# Patient Record
Sex: Female | Born: 1940 | Race: White | Hispanic: No | Smoking: Never smoker
Health system: Southern US, Community
[De-identification: ages and names within clinical notes are randomized; demographics above are authoritative.]

## PROBLEM LIST (undated history)

## (undated) DIAGNOSIS — I499 Cardiac arrhythmia, unspecified: Secondary | ICD-10-CM

## (undated) DIAGNOSIS — Q762 Congenital spondylolisthesis: Secondary | ICD-10-CM

## (undated) DIAGNOSIS — I729 Aneurysm of unspecified site: Secondary | ICD-10-CM

## (undated) DIAGNOSIS — M4800 Spinal stenosis, site unspecified: Secondary | ICD-10-CM

## (undated) DIAGNOSIS — E785 Hyperlipidemia, unspecified: Secondary | ICD-10-CM

## (undated) DIAGNOSIS — I341 Nonrheumatic mitral (valve) prolapse: Secondary | ICD-10-CM

## (undated) HISTORY — PX: BREAST BIOPSY: SHX20

## (undated) HISTORY — DX: Cardiac arrhythmia, unspecified: I49.9

## (undated) HISTORY — PX: BREAST RECONSTRUCTION WITH PLACEMENT OF TISSUE EXPANDER AND ALLODERM: SHX6805

## (undated) HISTORY — PX: ABDOMINAL HYSTERECTOMY: SHX81

## (undated) HISTORY — PX: TUBAL LIGATION: SHX77

## (undated) HISTORY — DX: Spinal stenosis, site unspecified: M48.00

## (undated) HISTORY — DX: Congenital spondylolisthesis: Q76.2

## (undated) HISTORY — PX: AUGMENTATION MAMMAPLASTY: SUR837

## (undated) HISTORY — DX: Aneurysm of unspecified site: I72.9

## (undated) HISTORY — DX: Nonrheumatic mitral (valve) prolapse: I34.1

## (undated) HISTORY — DX: Hyperlipidemia, unspecified: E78.5

---

## 2020-03-30 LAB — HM MAMMOGRAPHY

## 2020-05-09 LAB — HM MAMMOGRAPHY

## 2020-09-23 ENCOUNTER — Encounter (HOSPITAL_COMMUNITY): Payer: Self-pay

## 2020-09-23 ENCOUNTER — Other Ambulatory Visit: Payer: Self-pay

## 2020-09-23 ENCOUNTER — Ambulatory Visit (HOSPITAL_COMMUNITY)
Admission: EM | Admit: 2020-09-23 | Discharge: 2020-09-23 | Disposition: A | Discharge status: Home / Self Care | Payer: PPO | Attending: Emergency Medicine | Admitting: Emergency Medicine

## 2020-09-23 DIAGNOSIS — N390 Urinary tract infection, site not specified: Secondary | ICD-10-CM | POA: Diagnosis not present

## 2020-09-23 DIAGNOSIS — R350 Frequency of micturition: Secondary | ICD-10-CM

## 2020-09-23 HISTORY — DX: Cardiac arrhythmia, unspecified: I49.9

## 2020-09-23 LAB — POCT URINALYSIS DIPSTICK, ED / UC
Bilirubin Urine: NEGATIVE
Glucose, UA: NEGATIVE mg/dL
Ketones, ur: NEGATIVE mg/dL
Nitrite: POSITIVE — AB
Protein, ur: NEGATIVE mg/dL
Specific Gravity, Urine: 1.01 (ref 1.005–1.030)
Urobilinogen, UA: 0.2 mg/dL (ref 0.0–1.0)
pH: 6 (ref 5.0–8.0)

## 2020-09-23 MED ORDER — NITROFURANTOIN MONOHYD MACRO 100 MG PO CAPS: 100.0000 mg | ORAL_CAPSULE | Freq: Two times a day (BID) | ORAL | 0 refills | Status: DC

## 2020-09-23 NOTE — Discharge Instructions (Addendum)
Take the Macrobid twice daily on an empty stomach for 5 days.  We will send off urine for culture and if we need to adjust the antibiotics based on the results we will.  You may continue the Azo as needed for urinary discomfort.  Increase your oral fluid intake so that you increase your urine production and flush your urinary system.  If you develop a fever, back pain, nausea or vomiting and cannot keep fluids down please go to the ER for evaluation.

## 2020-09-23 NOTE — ED Triage Notes (Signed)
Pt reports increased urinary frequency and lower back pain x 3-4 days. Denies fever, chills, nausea, abdominal pain. Metamine and sodium salicylate gives relief.

## 2020-09-23 NOTE — ED Provider Notes (Signed)
[place]    CSN: [id number] Arrival date & time: 09/23/20  1450      History   Chief Complaint Chief Complaint  Patient presents with  . Appointment  . Urinary Frequency    HPI Yolanda Grant is a 79 y.o. female.   HPI   79 year old female here for evaluation of urinary urgency, frequency, and pain.  Patient reports that her symptoms have been going on for the last 3 to 4 days.  She denies any blood in her urine, fever, nausea or vomiting, or abdominal pain.  Patient has had some associated low back pain and has noticed that her urine has been cloudy.  Patient has been taking Azo-Standard over-the-counter to help with the discomfort.  Patient has a longstanding history of UTIs  Past Medical History:  Diagnosis Date  . Irregular heartbeat     There are no problems to display for this patient.   Past Surgical History:  Procedure Laterality Date  . ABDOMINAL HYSTERECTOMY      OB History   No obstetric history on file.      Home Medications    Prior to Admission medications   Medication Sig Start Date End Date Taking? Authorizing Provider  nitrofurantoin, macrocrystal-monohydrate, (MACROBID) 100 MG capsule Take 1 capsule (100 mg total) by mouth 2 (two) times daily. 09/23/20   Margarette Canada, NP  pravastatin (PRAVACHOL) 20 MG tablet Take 20 mg by mouth daily. 09/20/20   [provider]  raloxifene (EVISTA) 60 MG tablet Take 60 mg by mouth daily. 09/20/20   [provider]    Family History History reviewed. No pertinent family history.  Social History Social History   Tobacco Use  . Smoking status: Never Smoker  . Smokeless tobacco: Never Used  Substance Use Topics  . Alcohol use: Not Currently  . Drug use: Never     Allergies   Augmentin [amoxicillin-pot clavulanate] and Sulfur   Review of Systems Review of Systems  Constitutional: Negative for fever.  Gastrointestinal: Negative for nausea and vomiting.   Genitourinary: Positive for dysuria, frequency and urgency. Negative for hematuria.  Musculoskeletal: Positive for back pain.  Skin: Negative for rash.  Hematological: Negative.   Psychiatric/Behavioral: Negative.      Physical Exam Triage Vital Signs ED Triage Vitals  Enc Vitals Group     BP 09/23/20 1511 (!) 107/55     Pulse Rate 09/23/20 1511 73     Resp 09/23/20 1511 17     Temp 09/23/20 1511 97.8 F (36.6 C)     Temp Source 09/23/20 1511 Oral     SpO2 09/23/20 1511 98 %     Weight --      Height --      Head Circumference --      Peak Flow --      Pain Score 09/23/20 1506 3     Pain Loc --      Pain Edu? --      Excl. in [place]? --    No data found.  Updated Vital Signs BP (!) 107/55 (BP Location: Right Arm)   Pulse 73   Temp 97.8 F (36.6 C) (Oral)   Resp 17   SpO2 98%   Visual Acuity Right Eye Distance:   Left Eye Distance:   Bilateral Distance:    Right Eye Near:   Left Eye Near:    Bilateral Near:     Physical Exam Vitals and nursing note reviewed.  Constitutional:  General: She is not in acute distress.    Appearance: Normal appearance. She is not toxic-appearing.  Cardiovascular:     Rate and Rhythm: Normal rate and regular rhythm.     Pulses: Normal pulses.     Heart sounds: Normal heart sounds. No murmur heard. No gallop.   Pulmonary:     Effort: Pulmonary effort is normal.     Breath sounds: Normal breath sounds. No wheezing, rhonchi or rales.  Abdominal:     Tenderness: There is no right CVA tenderness or left CVA tenderness.  Skin:    General: Skin is warm and dry.     Capillary Refill: Capillary refill takes less than 2 seconds.     Findings: No rash.  Neurological:     General: No focal deficit present.     Mental Status: She is alert and oriented to person, place, and time.  Psychiatric:        Mood and Affect: Mood normal.        Behavior: Behavior normal.        Thought Content: Thought content normal.        Judgment:  Judgment normal.      UC Treatments / Results  Labs (all labs ordered are listed, but only abnormal results are displayed) Labs Reviewed  POCT URINALYSIS DIPSTICK, ED / UC - Abnormal; Notable for the following components:      Result Value   Hgb urine dipstick SMALL (*)    Nitrite POSITIVE (*)    Leukocytes,Ua LARGE (*)    All other components within normal limits  URINE CULTURE    EKG   Radiology No results found.  Procedures Procedures (including critical care time)  Medications Ordered in UC Medications - No data to display  Initial Impression / Assessment and Plan / UC Course  I have reviewed the triage vital signs and the nursing notes.  Pertinent labs & imaging results that were available during my care of the patient were reviewed by me and considered in my medical decision making (see chart for details).   Is here for evaluation of UTI symptoms that started 3 to 4 days ago.  Patient is nontoxic in appearance.  Patient is negative CVA tenderness on exam.  UA dip is positive for nitrites and large amount of leukocytes with small blood.  Will send urine for culture.  Patient does have an allergy to Augmentin and sulfa.  Patient states she is unsure of exactly what her reaction to Augmentin was she reports that it made her very ill.  Will treat with Macrobid.   Final Clinical Impressions(s) / UC Diagnoses   Final diagnoses:  Lower urinary tract infectious disease     Discharge Instructions     Take the Macrobid twice daily on an empty stomach for 5 days.  We will send off urine for culture and if we need to adjust the antibiotics based on the results we will.  You may continue the Azo as needed for urinary discomfort.  Increase your oral fluid intake so that you increase your urine production and flush your urinary system.  If you develop a fever, back pain, nausea or vomiting and cannot keep fluids down please go to the ER for evaluation.    ED  Prescriptions    Medication Sig Dispense Auth. Provider   nitrofurantoin, macrocrystal-monohydrate, (MACROBID) 100 MG capsule Take 1 capsule (100 mg total) by mouth 2 (two) times daily. 10 capsule Margarette Canada, NP  PDMP not reviewed this encounter.   Margarette Canada, NP 09/23/20 1544

## 2020-09-26 LAB — URINE CULTURE
Culture: >=100000 — AB
Special Requests: NORMAL

## 2020-09-27 ENCOUNTER — Telehealth (HOSPITAL_COMMUNITY): Payer: Self-pay | Admitting: Urgent Care

## 2020-09-27 ENCOUNTER — Encounter (HOSPITAL_COMMUNITY): Payer: Self-pay | Admitting: Urgent Care

## 2020-09-27 MED ORDER — CEPHALEXIN 500 MG PO CAPS: 500.0000 mg | ORAL_CAPSULE | Freq: Two times a day (BID) | ORAL | 0 refills | Status: DC

## 2020-09-27 NOTE — Telephone Encounter (Signed)
Patient has been feeling ill still despite taking Macrobid.  Urine culture shows that susceptibility is therefore Macrobid and all other antibiotics.  Regarding her allergies, she has a history of intolerance to Augmentin but is able to take Keflex.  Recommended switching to this.  Have a low threshold to come in for recheck.

## 2020-11-06 DIAGNOSIS — M810 Age-related osteoporosis without current pathological fracture: Secondary | ICD-10-CM | POA: Insufficient documentation

## 2020-11-06 DIAGNOSIS — E785 Hyperlipidemia, unspecified: Secondary | ICD-10-CM | POA: Insufficient documentation

## 2020-11-06 NOTE — Progress Notes (Signed)
Subjective:    Patient ID: Yolanda Grant, female    DOB: Apr 07, 1941, 80 y.o.   MRN: [id number]  HPI  She is here to establish with a new pcp.   The patient is here for follow up of their chronic medical problems.  She has osteoporosis and is currently taking Evista. Her bone density is up-to-date. She will have her records sent up here.  She was also seen electrophysiologist where she lived previously. She has an irregular heart rate, but is not exactly sure what the official diagnosis is. She can have a cardiology note sent up. She takes metoprolol daily and that controls her irregular rhythm.  She also has hyperlipidemia and takes pravastatin. With moving here and not being into her regular routine she has not been eating as healthy.  She has not been exercising regularly. Years past she would exercise very regularly, but with moving and getting adjusted to her new routine she has not been doing that. She does plan on restarting.    Medications and allergies reviewed with patient and updated if appropriate.  Patient Active Problem List   Diagnosis Date Noted  . Hyperlipidemia 11/06/2020  . Osteoporosis 11/06/2020    Current Outpatient Medications on File Prior to Visit  Medication Sig Dispense Refill  . metoprolol succinate (TOPROL-XL) 50 MG 24 hr tablet Take 50 mg by mouth daily. Take with or immediately following a meal.    . pravastatin (PRAVACHOL) 20 MG tablet Take 20 mg by mouth daily.    . raloxifene (EVISTA) 60 MG tablet Take 60 mg by mouth daily.     No current facility-administered medications on file prior to visit.    Past Medical History:  Diagnosis Date  . Irregular heartbeat     Past Surgical History:  Procedure Laterality Date  . ABDOMINAL HYSTERECTOMY      Social History   Socioeconomic History  . Marital status: Widowed    Spouse name: Not on file  . Number of children: Not on file  . Years of education: Not on file  . Highest education  level: Not on file  Occupational History  . Not on file  Tobacco Use  . Smoking status: Never Smoker  . Smokeless tobacco: Never Used  Substance and Sexual Activity  . Alcohol use: Not Currently  . Drug use: Never  . Sexual activity: Not on file  Other Topics Concern  . Not on file  Social History Narrative  . Not on file   Social Determinants of Health   Financial Resource Strain: Not on file  Food Insecurity: Not on file  Transportation Needs: Not on file  Physical Activity: Not on file  Stress: Not on file  Social Connections: Not on file    History reviewed. No pertinent family history.  Review of Systems  Constitutional: Negative for chills and fever.  HENT: Negative for trouble swallowing.   Respiratory: Positive for shortness of breath. Negative for cough and wheezing.   Cardiovascular: Positive for leg swelling. Negative for chest pain and palpitations (rare - at night).  Gastrointestinal: Negative for abdominal pain, blood in stool, constipation, diarrhea and nausea.       GERD controlled  Genitourinary: Negative for dysuria and frequency.  Musculoskeletal: Positive for arthralgias (hands - not painful) and back pain (lower back  - if stands long periods).  Neurological: Negative for dizziness, light-headedness and headaches.  Psychiatric/Behavioral: Negative for dysphoric mood. The patient is not nervous/anxious.  Objective:   Vitals:   11/07/20 1259  BP: 112/68  Pulse: 60  Temp: 98 F (36.7 C)  SpO2: 99%   BP Readings from Last 3 Encounters:  11/07/20 112/68  09/23/20 (!) 107/55   Wt Readings from Last 3 Encounters:  11/07/20 157 lb (71.2 kg)   Body mass index is 26.13 kg/m.   Depression screen [facility] 2/9 11/07/2020  Decreased Interest 0  Down, Depressed, Hopeless 0  PHQ - 2 Score 0  Altered sleeping 0  Tired, decreased energy 0  Change in appetite 0  Feeling bad or failure about yourself  0  Trouble concentrating 0  Moving slowly or  fidgety/restless 0  Suicidal thoughts 0  PHQ-9 Score 0  Difficult doing work/chores Not difficult at all      Physical Exam    Constitutional: Appears well-developed and well-nourished. No distress.  HENT:  Head: Normocephalic and atraumatic.  Neck: Neck supple. No tracheal deviation present. No thyromegaly present.  No cervical lymphadenopathy Cardiovascular: Normal rate, regular rhythm and normal heart sounds.   No murmur heard. No carotid bruit .  No edema Pulmonary/Chest: Effort normal and breath sounds normal. No respiratory distress. No has no wheezes. No rales.  Skin: Skin is warm and dry. Not diaphoretic.  Psychiatric: Normal mood and affect. Behavior is normal.      Assessment & Plan:    See Problem List for Assessment and Plan of chronic medical problems.    Screened for depression using the PHQ 9 scale.  No evidence of depression.     This visit occurred during the SARS-CoV-2 public health emergency.  Safety protocols were in place, including screening questions prior to the visit, additional usage of staff PPE, and extensive cleaning of exam room while observing appropriate contact time as indicated for disinfecting solutions.

## 2020-11-07 ENCOUNTER — Ambulatory Visit (INDEPENDENT_AMBULATORY_CARE_PROVIDER_SITE_OTHER): Payer: PPO | Admitting: Internal Medicine

## 2020-11-07 ENCOUNTER — Other Ambulatory Visit: Payer: Self-pay

## 2020-11-07 ENCOUNTER — Encounter: Payer: Self-pay | Admitting: Internal Medicine

## 2020-11-07 ENCOUNTER — Ambulatory Visit: Payer: PPO | Admitting: Internal Medicine

## 2020-11-07 ENCOUNTER — Encounter (INDEPENDENT_AMBULATORY_CARE_PROVIDER_SITE_OTHER): Payer: Self-pay

## 2020-11-07 VITALS — BP 112/68 | HR 60 | Temp 98.0°F | Ht 65.0 in | Wt 157.0 lb

## 2020-11-07 DIAGNOSIS — I499 Cardiac arrhythmia, unspecified: Secondary | ICD-10-CM | POA: Diagnosis not present

## 2020-11-07 DIAGNOSIS — K219 Gastro-esophageal reflux disease without esophagitis: Secondary | ICD-10-CM | POA: Diagnosis not present

## 2020-11-07 DIAGNOSIS — I471 Supraventricular tachycardia: Secondary | ICD-10-CM | POA: Insufficient documentation

## 2020-11-07 DIAGNOSIS — M1990 Unspecified osteoarthritis, unspecified site: Secondary | ICD-10-CM | POA: Insufficient documentation

## 2020-11-07 DIAGNOSIS — M810 Age-related osteoporosis without current pathological fracture: Secondary | ICD-10-CM

## 2020-11-07 DIAGNOSIS — E782 Mixed hyperlipidemia: Secondary | ICD-10-CM | POA: Diagnosis not present

## 2020-11-07 DIAGNOSIS — I4719 Other supraventricular tachycardia: Secondary | ICD-10-CM | POA: Insufficient documentation

## 2020-11-07 NOTE — Patient Instructions (Addendum)
  Medications changes include :  none    A referral was ordered for Dr Curt Bears.       Someone from their office will call you to schedule an appointment.    Please followup in 6 months or so for a physical

## 2020-11-07 NOTE — Assessment & Plan Note (Signed)
Chronic Continue pravastatin 20 mg daily Regular exercise and healthy diet encouraged We will check blood work at her next visit

## 2020-11-07 NOTE — Assessment & Plan Note (Signed)
Chronic DEXA up-to-date-we will get those records Was on EVista for years, taken off for short period of time and then this was restarted 02/2020 Will review records once available Continue the evista arms daily Continue calcium and vitamin D Will restart regular exercise

## 2020-11-07 NOTE — Assessment & Plan Note (Signed)
Chronic GERD controlled Continue omeprazole 20 mg daily  

## 2020-11-07 NOTE — Assessment & Plan Note (Signed)
Chronic She is unsure what exactly irregularity she has Takes metoprolol XL 50 mg daily and this has controlled it Often feels it at night if she does have an episode-coughs and it goes away She would like to see an electrophysiologist-referred to Dr. Curt Bears She will have her records sent up Continue metoprolol XL 50 mg daily

## 2020-11-10 ENCOUNTER — Encounter: Payer: Self-pay | Admitting: Internal Medicine

## 2020-11-10 DIAGNOSIS — R3129 Other microscopic hematuria: Secondary | ICD-10-CM | POA: Insufficient documentation

## 2020-11-10 DIAGNOSIS — Z872 Personal history of diseases of the skin and subcutaneous tissue: Secondary | ICD-10-CM | POA: Insufficient documentation

## 2020-11-10 DIAGNOSIS — I351 Nonrheumatic aortic (valve) insufficiency: Secondary | ICD-10-CM | POA: Insufficient documentation

## 2020-11-10 DIAGNOSIS — Z87440 Personal history of urinary (tract) infections: Secondary | ICD-10-CM | POA: Insufficient documentation

## 2020-11-11 ENCOUNTER — Encounter: Payer: Self-pay | Admitting: Internal Medicine

## 2020-11-11 NOTE — Progress Notes (Signed)
Outside notes received. Information abstracted. Notes sent to scan.  

## 2020-12-02 ENCOUNTER — Telehealth: Payer: Self-pay | Admitting: Internal Medicine

## 2020-12-02 NOTE — Telephone Encounter (Signed)
Patient is requesting a refill on the following medication. She states the provider told her at her [place] on 1/31 she would send over whatever medications she needs.  metoprolol succinate (TOPROL-XL) 50 MG 24 hr tablet   Pharmacy on file.

## 2020-12-05 ENCOUNTER — Other Ambulatory Visit: Payer: Self-pay

## 2020-12-05 MED ORDER — METOPROLOL SUCCINATE ER 50 MG PO TB24: 50.0000 mg | ORAL_TABLET | Freq: Every day | ORAL | 1 refills | Status: DC

## 2020-12-05 NOTE — Telephone Encounter (Signed)
1 tab daily - 90 with 1 refill

## 2020-12-16 ENCOUNTER — Encounter: Payer: Self-pay | Admitting: Cardiology

## 2020-12-16 ENCOUNTER — Ambulatory Visit: Payer: PPO | Admitting: Cardiology

## 2020-12-16 ENCOUNTER — Other Ambulatory Visit: Payer: Self-pay

## 2020-12-16 VITALS — BP 126/74 | HR 61 | Ht 65.0 in | Wt 157.2 lb

## 2020-12-16 DIAGNOSIS — R002 Palpitations: Secondary | ICD-10-CM

## 2020-12-16 MED ORDER — PRAVASTATIN SODIUM 20 MG PO TABS: 20.0000 mg | ORAL_TABLET | Freq: Every day | ORAL | 3 refills | Status: DC

## 2020-12-16 NOTE — Patient Instructions (Signed)
Medication Instructions:  Your physician recommends that you continue on your current medications as directed. Please refer to the Current Medication list given to you today.  *If you need a refill on your cardiac medications before your next appointment, please call your pharmacy*   Lab Work: None ordered   Testing/Procedures: None ordered   Follow-Up: At [facility], you and your health needs are our priority.  As part of our continuing mission to provide you with exceptional heart care, we have created designated Provider Care Teams.  These Care Teams include your primary Cardiologist (physician) and Advanced Practice Providers (APPs -  Physician Assistants and Nurse Practitioners) who all work together to provide you with the care you need, when you need it.  Your next appointment:   1 year(s)  The format for your next appointment:   In Person  Provider:   General cardiology     Thank you for choosing [facility]!!   Trinidad Curet, RN [phone]

## 2020-12-16 NOTE — Progress Notes (Signed)
Electrophysiology Office Note   Date:  12/16/2020   ID:  Yolanda Grant, DOB April 09, 1941, MRN [id number]  PCP:  Binnie Rail, MD  General cardiology:   Primary Electrophysiologist:  Will Meredith Leeds, MD    Chief Complaint: Establish care   History of Present Illness: Yolanda Grant is a 80 y.o. female who is being seen today for the evaluation of palpitations at the request of Burns, Claudina Lick, MD. Presenting today for electrophysiology evaluation.  She has a history of mitral valve prolapse, mild to moderate aortic valve disease with moderate AI, hyperlipidemia.  There are referral notes stating that it is difficult to tell whether or not her aortic valve is bileaflet.    Today, she denies symptoms of palpitations, chest pain, shortness of breath, orthopnea, PND, lower extremity edema, claudication, dizziness, presyncope, syncope, bleeding, or neurologic sequela. The patient is tolerating medications without difficulties.  She has rare palpitations.  Palpitations occur every few weeks and last a minute or 2 at a time.  She is currently comfortable with her palpitations.  She is currently asymptomatic from her aortic and mitral valve disease.  She has no complaints at this time.  She presents to establish care.   Past Medical History:  Diagnosis Date  . Aneurysm ([place])   . Arrhythmia   . Congenital spondylolisthesis of lumbar region   . Hyperlipidemia   . Irregular heartbeat   . Mitral valve prolapse   . Spinal stenosis    Past Surgical History:  Procedure Laterality Date  . ABDOMINAL HYSTERECTOMY     anemia, heavy bleeding  . BREAST RECONSTRUCTION WITH PLACEMENT OF TISSUE EXPANDER AND ALLODERM    . TUBAL LIGATION       Current Outpatient Medications  Medication Sig Dispense Refill  . Ascorbic Acid (VITAMIN C PLUS WILD ROSE HIPS PO) Take by mouth.    . Biotin 10000 MCG TABS Take 10,000 mcg by mouth in the morning.    . Calcium Carb-Cholecalciferol (CALCIUM 1000 + D  PO) Take 1,000 mg by mouth in the morning.    . Calcium Carbonate-Vitamin D3 600-400 MG-UNIT TABS Take by mouth. 600 mg/ 800 if vitamin D3    . Cholecalciferol (VITAMIN D3) 50 MCG (2000 UT) TABS Take by mouth.    . Cranberry 500 MG CAPS Take 500 mg by mouth every evening.    . D-Mannose POWD Take by mouth.    . metoprolol succinate (TOPROL-XL) 50 MG 24 hr tablet Take 1 tablet (50 mg total) by mouth daily. Take one table by mouth daily 90 tablet 1  . Multiple Vitamin (ONE-DAILY MULTI-VITAMIN) TABS Take by mouth.    . OMEPRAZOLE PO Take 20 mg by mouth.    . pravastatin (PRAVACHOL) 20 MG tablet Take 20 mg by mouth daily.    . Probiotic Product (PROBIOTIC PO) Take by mouth.    . Psyllium (METAMUCIL FIBER) 51.7 % PACK Take by mouth.    . raloxifene (EVISTA) 60 MG tablet Take 60 mg by mouth daily.    . Turmeric 500 MG CAPS Take by mouth.    . Zinc 50 MG CAPS Take by mouth.     No current facility-administered medications for this visit.    Allergies:   Augmentin [amoxicillin-pot clavulanate] and Elemental sulfur   Social History:  The patient  reports that she has never smoked. She has never used smokeless tobacco. She reports previous alcohol use. She reports that she does not use drugs.   Family History:  The patient's family history includes Breast cancer in her mother.    ROS:  Please see the history of present illness.   Otherwise, review of systems is positive for none.   All other systems are reviewed and negative.    PHYSICAL EXAM: VS:  BP 126/74   Pulse 61   Ht 5\' 5"  (1.651 m)   Wt 157 lb 3.2 oz (71.3 kg)   SpO2 99%   BMI 26.16 kg/m  , BMI Body mass index is 26.16 kg/m. GEN: Well nourished, well developed, in no acute distress  HEENT: normal  Neck: no JVD, carotid bruits, or masses Cardiac: RRR; no murmurs, rubs, or gallops,no edema  Respiratory:  clear to auscultation bilaterally, normal work of breathing GI: soft, nontender, nondistended, + BS MS: no deformity or  atrophy  Skin: warm and dry Neuro:  Strength and sensation are intact Psych: euthymic mood, full affect  EKG:  EKG is ordered today. Personal review of the ekg ordered shows sinus rhythm, rate 61  Recent Labs: No results found for requested labs within last 8760 hours.    Lipid Panel  No results found for: CHOL, TRIG, HDL, CHOLHDL, VLDL, LDLCALC, LDLDIRECT   Wt Readings from Last 3 Encounters:  12/16/20 157 lb 3.2 oz (71.3 kg)  11/07/20 157 lb (71.2 kg)      Other studies Reviewed: Additional studies/ records that were reviewed today include: TTE 03/29/2020 Review of the above records today demonstrates:  Ejection fraction 55 to 60% Left atrial index 40 mL/m right ventricle normal in size and function Mild to moderate aortic regurgitation Trileaflet aortic valve Mild mitral regurgitation Mild tricuspid regurgitation Aortic root normal in size IVC is normal in size with normal collapsibility index No pericardial effusion  ASSESSMENT AND PLAN:  1.  Aortic insufficiency: Minimal murmur noted on exam.  Patient is well compensated.  No changes.  2.  Palpitations: Rarely.  Patient apparently is on Toprol-XL with palpitations.  We will continue with current management.  3.  Mitral valve prolapse with mitral regurgitation: Has remained stable.  Asymptomatic.    Current medicines are reviewed at length with the patient today.   The patient does not have concerns regarding her medicines.  The following changes were made today:  none  Labs/ tests ordered today include:  Orders Placed This Encounter  Procedures  . EKG 12-Lead     Disposition:   FU with 1 year  Signed, Will Meredith Leeds, MD  12/16/2020 10:26 AM     [facility] [address] [place] [address] [phone] (office) [phone] (fax)

## 2020-12-16 NOTE — Addendum Note (Signed)
Addended by: Stanton Kidney on: 12/16/2020 10:28 AM   Modules accepted: Orders

## 2021-01-04 DIAGNOSIS — Z79891 Long term (current) use of opiate analgesic: Secondary | ICD-10-CM | POA: Diagnosis not present

## 2021-01-04 DIAGNOSIS — M533 Sacrococcygeal disorders, not elsewhere classified: Secondary | ICD-10-CM | POA: Diagnosis not present

## 2021-01-04 DIAGNOSIS — M47817 Spondylosis without myelopathy or radiculopathy, lumbosacral region: Secondary | ICD-10-CM | POA: Diagnosis not present

## 2021-01-04 DIAGNOSIS — Z79899 Other long term (current) drug therapy: Secondary | ICD-10-CM | POA: Diagnosis not present

## 2021-01-04 DIAGNOSIS — G894 Chronic pain syndrome: Secondary | ICD-10-CM | POA: Diagnosis not present

## 2021-01-04 DIAGNOSIS — M5459 Other low back pain: Secondary | ICD-10-CM | POA: Diagnosis not present

## 2021-01-06 ENCOUNTER — Other Ambulatory Visit: Payer: Self-pay | Admitting: Physician Assistant

## 2021-01-06 ENCOUNTER — Ambulatory Visit
Admission: RE | Admit: 2021-01-06 | Discharge: 2021-01-06 | Disposition: A | Discharge status: Home / Self Care | Payer: PPO | Source: Ambulatory Visit | Attending: Physician Assistant | Admitting: Physician Assistant

## 2021-01-06 DIAGNOSIS — M545 Low back pain, unspecified: Secondary | ICD-10-CM | POA: Diagnosis not present

## 2021-01-06 DIAGNOSIS — M5459 Other low back pain: Secondary | ICD-10-CM

## 2021-02-13 ENCOUNTER — Ambulatory Visit: Payer: PPO | Admitting: Podiatry

## 2021-02-13 ENCOUNTER — Other Ambulatory Visit: Payer: Self-pay

## 2021-02-13 ENCOUNTER — Encounter: Payer: Self-pay | Admitting: Podiatry

## 2021-02-13 DIAGNOSIS — L603 Nail dystrophy: Secondary | ICD-10-CM | POA: Diagnosis not present

## 2021-02-13 DIAGNOSIS — M2041 Other hammer toe(s) (acquired), right foot: Secondary | ICD-10-CM | POA: Insufficient documentation

## 2021-02-13 DIAGNOSIS — M2042 Other hammer toe(s) (acquired), left foot: Secondary | ICD-10-CM | POA: Diagnosis not present

## 2021-02-13 NOTE — Progress Notes (Addendum)
This patient presents to the office with painful second toenail right foot.  She says this toenail is painful walking and wearing her shoes.  The second toenail is thick since she has hammer toe second right foot.  She has tried multiple types of hammer toe pads but the nail pain and hammer toes persists.  She presents to the office for evaluation and treatment.  Vascular  Dorsalis pedis and posterior tibial pulses are palpable  B/L.  Capillary return  WNL.  Temperature gradient is  WNL.  Skin turgor  WNL  Sensorium  Senn Weinstein monofilament wire  WNL. Normal tactile sensation.  Nail Exam  Patient has normal nails with no evidence of bacterial or fungal infection. Ingrowing toenail second toe right foot.  Orthopedic  Exam  Muscle tone and muscle strength  WNL.  No limitations of motion feet  B/L.  No crepitus or joint effusion noted.  Foot type is unremarkable.  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  Tailors bunion  5th  B/L.  Skin  No open lesions.  Normal skin texture and turgor.  Nail dystrophy secondary to hammer toe second right.  IE.  Debride thickened toenail second toe right foot.  Discussed her conservative treatment with this patient.  Patient says she desires surgical correction.  Patient to be referred to Dr. Paulla Dolly.  RTC prn.   Gardiner Barefoot DPM

## 2021-02-22 ENCOUNTER — Ambulatory Visit: Payer: PPO | Admitting: Podiatry

## 2021-02-23 ENCOUNTER — Other Ambulatory Visit: Payer: Self-pay

## 2021-02-23 ENCOUNTER — Ambulatory Visit: Payer: PPO | Admitting: Podiatry

## 2021-02-23 ENCOUNTER — Ambulatory Visit (INDEPENDENT_AMBULATORY_CARE_PROVIDER_SITE_OTHER): Payer: PPO

## 2021-02-23 DIAGNOSIS — M2041 Other hammer toe(s) (acquired), right foot: Secondary | ICD-10-CM

## 2021-02-23 NOTE — Progress Notes (Signed)
Subjective:   Patient ID: Yolanda Grant, female   DOB: 80 y.o.   MRN: [id number]   HPI Patient presents stating that she has a rigidly contracted digit to right which is increasingly painful and she was referred to me to have this fixed.  States he gets sore   ROS      Objective:  Physical Exam  Neurovascular status intact with patient found to have rigid contracture digit to right with irritation of the dorsal bone structure and elongation of the digit with nail disease also noted.  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  Patient is given consent form going over alternative treatments complications and after review signed consent form and is scheduled for outpatient surgery.  Patient is encouraged to call questions concerns prior and she met with our coordinator and went over the logistics of procedure.  I did explain there is no long-term guarantees of the toe probably will elevate some and may take upwards of 6 months to heal  X-rays indicate that there is rigid contracture digit to right at the MPJ with structural deformity at the proximal joint

## 2021-02-27 ENCOUNTER — Other Ambulatory Visit (INDEPENDENT_AMBULATORY_CARE_PROVIDER_SITE_OTHER): Payer: PPO

## 2021-02-27 ENCOUNTER — Other Ambulatory Visit: Payer: Self-pay

## 2021-02-27 ENCOUNTER — Telehealth: Payer: Self-pay | Admitting: Internal Medicine

## 2021-02-27 DIAGNOSIS — H31092 Other chorioretinal scars, left eye: Secondary | ICD-10-CM | POA: Diagnosis not present

## 2021-02-27 DIAGNOSIS — R300 Dysuria: Secondary | ICD-10-CM | POA: Diagnosis not present

## 2021-02-27 DIAGNOSIS — H2513 Age-related nuclear cataract, bilateral: Secondary | ICD-10-CM | POA: Diagnosis not present

## 2021-02-27 DIAGNOSIS — H04123 Dry eye syndrome of bilateral lacrimal glands: Secondary | ICD-10-CM | POA: Diagnosis not present

## 2021-02-27 DIAGNOSIS — H43812 Vitreous degeneration, left eye: Secondary | ICD-10-CM | POA: Diagnosis not present

## 2021-02-27 LAB — URINALYSIS, ROUTINE W REFLEX MICROSCOPIC
Bilirubin Urine: NEGATIVE
Ketones, ur: NEGATIVE
Nitrite: NEGATIVE
Specific Gravity, Urine: 1.015 (ref 1.000–1.030)
Total Protein, Urine: NEGATIVE
Urine Glucose: NEGATIVE
Urobilinogen, UA: 0.2 (ref 0.0–1.0)
pH: 6 (ref 5.0–8.0)

## 2021-02-27 NOTE — Telephone Encounter (Signed)
Can come and give sample- ordered

## 2021-02-27 NOTE — Telephone Encounter (Signed)
    Patient requesting order for urine test; frequent, painful urination  Please advise

## 2021-02-27 NOTE — Telephone Encounter (Signed)
Spoke with patient today. 

## 2021-02-28 ENCOUNTER — Telehealth: Payer: Self-pay | Admitting: Urology

## 2021-02-28 NOTE — Telephone Encounter (Signed)
DOS - 03/07/21   [facility] REPAIR 2ND RIGHT --- [id number]   [employer]  EFFECTIVE DATE - 10/08/05   RECEIVED FAX FROM [employer] STATING THAT CPT CODE 41282 HAS BEEN APPROVED, AUTH # [id number], GOOD FROM 03/07/21 - 06/05/21

## 2021-03-01 ENCOUNTER — Telehealth: Payer: Self-pay | Admitting: Internal Medicine

## 2021-03-01 LAB — URINE CULTURE

## 2021-03-01 MED ORDER — NITROFURANTOIN MONOHYD MACRO 100 MG PO CAPS: 100.0000 mg | ORAL_CAPSULE | Freq: Two times a day (BID) | ORAL | 0 refills | Status: DC

## 2021-03-01 NOTE — Telephone Encounter (Signed)
Urinalysis shows possible infection, urine culture is still pending. I am not sure when the culture will return.  We can go ahead and start an antibiotic given her history and if it comes back negative have her stop the  Antibiotic if negative.   Let me know

## 2021-03-01 NOTE — Telephone Encounter (Signed)
Patient called in regards to UA and urine culture lab results. She can be reached at [phone]. Please advise

## 2021-03-03 MED ORDER — HYDROCODONE-ACETAMINOPHEN 10-325 MG PO TABS: 1.0000 | ORAL_TABLET | Freq: Three times a day (TID) | ORAL | 0 refills | Status: AC | PRN

## 2021-03-03 NOTE — Addendum Note (Signed)
Addended by: Wallene Huh on: 03/03/2021 12:49 PM   Modules accepted: Orders

## 2021-03-07 ENCOUNTER — Encounter: Payer: Self-pay | Admitting: Podiatry

## 2021-03-07 DIAGNOSIS — M2041 Other hammer toe(s) (acquired), right foot: Secondary | ICD-10-CM | POA: Diagnosis not present

## 2021-03-10 ENCOUNTER — Telehealth: Payer: Self-pay

## 2021-03-10 NOTE — Telephone Encounter (Signed)
Follow up post op call.

## 2021-03-13 ENCOUNTER — Other Ambulatory Visit: Payer: Self-pay

## 2021-03-13 ENCOUNTER — Ambulatory Visit (INDEPENDENT_AMBULATORY_CARE_PROVIDER_SITE_OTHER): Payer: PPO

## 2021-03-13 ENCOUNTER — Encounter: Payer: Self-pay | Admitting: Podiatry

## 2021-03-13 ENCOUNTER — Ambulatory Visit (INDEPENDENT_AMBULATORY_CARE_PROVIDER_SITE_OTHER): Payer: PPO | Admitting: Podiatry

## 2021-03-13 DIAGNOSIS — Z9889 Other specified postprocedural states: Secondary | ICD-10-CM

## 2021-03-13 DIAGNOSIS — M2042 Other hammer toe(s) (acquired), left foot: Secondary | ICD-10-CM

## 2021-03-14 NOTE — Progress Notes (Signed)
Subjective:   Patient ID: Yolanda Grant, female   DOB: 80 y.o.   MRN: [id number]   HPI Patient states doing very well with surgery very pleased   ROS      Objective:  Physical Exam  Neurovascular status intact with second digit doing well right pin in place slight elevation of the toe but good alignment overall      Assessment:  Doing well post digital fusion digit to right     Plan:  H&P explained how to lower the toe and sterile dressing reapplied and reappoint 2 weeks for suture removal or earlier if needed  X-rays indicate pin is in place no other signs of pathology noted good alignment noted

## 2021-03-27 ENCOUNTER — Ambulatory Visit (INDEPENDENT_AMBULATORY_CARE_PROVIDER_SITE_OTHER): Payer: PPO | Admitting: Podiatry

## 2021-03-27 ENCOUNTER — Other Ambulatory Visit: Payer: Self-pay

## 2021-03-27 ENCOUNTER — Encounter: Payer: Self-pay | Admitting: Podiatry

## 2021-03-27 ENCOUNTER — Ambulatory Visit (INDEPENDENT_AMBULATORY_CARE_PROVIDER_SITE_OTHER): Payer: PPO

## 2021-03-27 DIAGNOSIS — M2042 Other hammer toe(s) (acquired), left foot: Secondary | ICD-10-CM

## 2021-03-27 DIAGNOSIS — Z9889 Other specified postprocedural states: Secondary | ICD-10-CM

## 2021-03-27 NOTE — Progress Notes (Signed)
Subjective:   Patient ID: Yolanda Grant, female   DOB: 80 y.o.   MRN: [id number]   HPI Patient presents stating that her right second toe is doing well pin is intact stitches need to be removed   ROS      Objective:  Physical Exam  Neurovascular status intact negative Bevelyn Buckles' sign noted pin intact second digit right stitches intact     Assessment:  Doing well post digital fusion digit to right     Plan:  X-ray reviewed stitches removed wound edges coapted well dispensed a above ankle fascial brace with BioSkin material to lower the second toe and gave instructions on doing this which will also help control swelling and encouraged her to call any questions that may happen prior to next appointment  X-rays indicate the second toe is moderately lifted it is in place pins intact

## 2021-04-03 ENCOUNTER — Other Ambulatory Visit: Payer: Self-pay

## 2021-04-03 ENCOUNTER — Ambulatory Visit (INDEPENDENT_AMBULATORY_CARE_PROVIDER_SITE_OTHER): Payer: PPO

## 2021-04-03 VITALS — BP 110/60 | HR 61 | Temp 98.0°F | Ht 65.0 in | Wt 159.8 lb

## 2021-04-03 DIAGNOSIS — Z0000 Encounter for general adult medical examination without abnormal findings: Secondary | ICD-10-CM | POA: Diagnosis not present

## 2021-04-03 NOTE — Progress Notes (Signed)
Subjective:   Yolanda Grant is a 80 y.o. female who presents for Medicare Annual (Subsequent) preventive examination.  Review of Systems     Cardiac Risk Factors include: advanced age (>57men, >33 women);dyslipidemia     Objective:    Today's Vitals   04/03/21 1531  BP: 110/60  Pulse: 61  Temp: 98 F (36.7 C)  SpO2: 94%  Weight: 159 lb 12.8 oz (72.5 kg)  Height: 5\' 5"  (1.651 m)  PainSc: 0-No pain   Body mass index is 26.59 kg/m.  Advanced Directives 04/03/2021  Does Patient Have a Medical Advance Directive? Yes  Type of Advance Directive [place]  Does patient want to make changes to medical advance directive? No - Patient declined  Copy of [place] in Chart? No - copy requested    Current Medications (verified) Outpatient Encounter Medications as of 04/03/2021  Medication Sig   Ascorbic Acid (VITAMIN C PLUS WILD ROSE HIPS PO) Take by mouth.   Biotin 10000 MCG TABS Take 10,000 mcg by mouth in the morning.   Calcium Carb-Cholecalciferol (CALCIUM 1000 + D PO) Take 1,000 mg by mouth in the morning.   Calcium Carbonate-Vitamin D3 600-400 MG-UNIT TABS Take by mouth. 600 mg/ 800 if vitamin D3   Cranberry 500 MG CAPS Take 500 mg by mouth every evening.   D-Mannose POWD Take by mouth.   metoprolol succinate (TOPROL-XL) 50 MG 24 hr tablet Take 1 tablet (50 mg total) by mouth daily. Take one table by mouth daily   Multiple Vitamin (ONE-DAILY MULTI-VITAMIN) TABS Take by mouth.   OMEPRAZOLE PO Take 20 mg by mouth.   pravastatin (PRAVACHOL) 20 MG tablet Take 1 tablet (20 mg total) by mouth daily.   Probiotic Product (PROBIOTIC PO) Take by mouth.   Psyllium (METAMUCIL FIBER) 51.7 % PACK Take by mouth.   raloxifene (EVISTA) 60 MG tablet Take 60 mg by mouth daily.   Turmeric 500 MG CAPS Take by mouth.   Zinc 50 MG CAPS Take by mouth.   [DISCONTINUED] nitrofurantoin, macrocrystal-monohydrate, (MACROBID) 100 MG capsule Take 1 capsule (100 mg  total) by mouth 2 (two) times daily.   No facility-administered encounter medications on file as of 04/03/2021.    Allergies (verified) Augmentin [amoxicillin-pot clavulanate] and Elemental sulfur   History: Past Medical History:  Diagnosis Date   Aneurysm ([place])    Arrhythmia    Congenital spondylolisthesis of lumbar region    Hyperlipidemia    Irregular heartbeat    Mitral valve prolapse    Spinal stenosis    Past Surgical History:  Procedure Laterality Date   ABDOMINAL HYSTERECTOMY     anemia, heavy bleeding   BREAST RECONSTRUCTION WITH PLACEMENT OF TISSUE EXPANDER AND ALLODERM     TUBAL LIGATION     Family History  Problem Relation Age of Onset   Breast cancer Mother    Social History   Socioeconomic History   Marital status: Widowed    Spouse name: Not on file   Number of children: Not on file   Years of education: Not on file   Highest education level: Not on file  Occupational History   Not on file  Tobacco Use   Smoking status: Never   Smokeless tobacco: Never  Substance and Sexual Activity   Alcohol use: Not Currently   Drug use: Never   Sexual activity: Not on file  Other Topics Concern   Not on file  Social History Narrative   Not on  file   Social Determinants of Health   Financial Resource Strain: Low Risk    Difficulty of Paying Living Expenses: Not hard at all  Food Insecurity: No Food Insecurity   Worried About Charity fundraiser in the Last Year: Never true   Arboriculturist in the Last Year: Never true  Transportation Needs: No Transportation Needs   Lack of Transportation (Medical): No   Lack of Transportation (Non-Medical): No  Physical Activity: Sufficiently Active   Days of Exercise per Week: 5 days   Minutes of Exercise per Session: 30 min  Stress: No Stress Concern Present   Feeling of Stress : Not at all  Social Connections: Moderately Integrated   Frequency of Communication with Friends and Family: More than three times a  week   Frequency of Social Gatherings with Friends and Family: More than three times a week   Attends Religious Services: More than 4 times per year   Active Member of [employer] or Organizations: Yes   Attends Archivist Meetings: More than 4 times per year   Marital Status: Widowed    Tobacco Counseling Counseling given: Not Answered   Clinical Intake:  Pre-visit preparation completed: Yes  Pain : No/denies pain Pain Score: 0-No pain     BMI - recorded: 26.59 Nutritional Status: BMI 25 -29 Overweight Nutritional Risks: None Diabetes: No  How often do you need to have someone help you when you read instructions, pamphlets, or other written materials from your doctor or pharmacy?: 1 - Never What is the last grade level you completed in school?: Some college courses  Diabetic? no  Interpreter Needed?: No  Information entered by :: Lisette Abu, LPN   Activities of Daily Living In your present state of health, do you have any difficulty performing the following activities: 04/03/2021 11/07/2020  Hearing? N N  Vision? N N  Difficulty concentrating or making decisions? N N  Walking or climbing stairs? N N  Dressing or bathing? N N  Doing errands, shopping? N N  Preparing Food and eating ? N -  Using the Toilet? N -  In the past six months, have you accidently leaked urine? N -  Do you have problems with loss of bowel control? N -  Managing your Medications? N -  Managing your Finances? N -  Housekeeping or managing your Housekeeping? N -  Some recent data might be hidden    Patient Care Team: Binnie Rail, MD as PCP - General (Internal Medicine) Warden Fillers, MD as Consulting Physician (Ophthalmology)  Indicate any recent Medical Services you may have received from other than [facility] providers in the past year (date may be approximate).     Assessment:   This is a routine wellness examination for Yolanda Grant.  Hearing/Vision screen Hearing  Screening - Comments:: Patient denied any hearing difficulty. Vision Screening - Comments:: Patient wears glasses.  Had recent eye exam done by Dr. Warden Fillers.  Dietary issues and exercise activities discussed: Current Exercise Habits: The patient does not participate in regular exercise at present (due to left foot surgery), Type of exercise: walking;Other - see comments (at home exercise videos), Time (Minutes): 30, Frequency (Times/Week): 5, Weekly Exercise (Minutes/Week): 150, Intensity: Mild, Exercise limited by: orthopedic condition(s)   Goals Addressed               This Visit's Progress     Patient Stated (pt-stated)        My goal is to  lose 15 pounds by watching my diet and being more physically active.       Depression Screen PHQ 2/9 Scores 04/03/2021 11/07/2020  PHQ - 2 Score 0 0  PHQ- 9 Score - 0    Fall Risk Fall Risk  04/03/2021  Falls in the past year? 0  Number falls in past yr: 0  Injury with Fall? 0  Risk for fall due to : No Fall Risks  Follow up Falls evaluation completed    FALL RISK PREVENTION PERTAINING TO THE HOME:  Any stairs in or around the home? No  If so, are there any without handrails? No  Home free of loose throw rugs in walkways, pet beds, electrical cords, etc? Yes  Adequate lighting in your home to reduce risk of falls? Yes   ASSISTIVE DEVICES UTILIZED TO PREVENT FALLS:  Life alert? No  Use of a cane, walker or w/c? No  Grab bars in the bathroom? No  Shower chair or bench in shower? Yes  Elevated toilet seat or a handicapped toilet? Yes   TIMED UP AND GO:  Was the test performed? Yes .  Length of time to ambulate 10 feet: 8 sec.   Gait steady and fast with assistive device (patient had on right foot boot)  Cognitive Function: Normal cognitive status assessed by direct observation by this Nurse Health Advisor. No abnormalities found.          Immunizations Immunization History  Administered Date(s) Administered    Influenza, High Dose Seasonal PF 07/24/2018   Influenza-Unspecified 05/08/2020   Moderna Sars-Covid-2 Vaccination 10/30/2019, 11/27/2019   Pneumococcal Conjugate-13 02/08/2014   Pneumococcal Polysaccharide-23 02/21/2018   Td 12/19/2006   Tdap 02/15/2015   Zoster Recombinat (Shingrix) 11/16/2020   Zoster, Live 01/13/2008    TDAP status: Up to date  Flu Vaccine status: Up to date  Pneumococcal vaccine status: Up to date  Covid-19 vaccine status: Completed vaccines  Qualifies for Shingles Vaccine? Yes   Zostavax completed Yes   Shingrix Completed?: Yes (Patient is scheduled for second dose)  Screening Tests Health Maintenance  Topic Date Due   COVID-19 Vaccine (3 - Moderna risk series) 12/25/2019   Zoster Vaccines- Shingrix (2 of 2) 01/11/2021   INFLUENZA VACCINE  05/08/2021   TETANUS/TDAP  02/14/2025   DEXA SCAN  Completed   PNA vac Low Risk Adult  Completed   HPV VACCINES  Aged Out    Health Maintenance  Health Maintenance Due  Topic Date Due   COVID-19 Vaccine (3 - Moderna risk series) 12/25/2019   Zoster Vaccines- Shingrix (2 of 2) 01/11/2021    Colorectal cancer screening: No longer required.   Mammogram status: Completed 05/09/2020. Repeat every year  Bone Density status: Completed 03/30/2020. Results reflect: Bone density results: OSTEOPENIA. Repeat every 2 years.  Lung Cancer Screening: (Low Dose CT Chest recommended if Age 75-80 years, 30 pack-year currently smoking OR have quit w/in 15years.) does not qualify.   Lung Cancer Screening Referral: no  Additional Screening:  Hepatitis C Screening: does not qualify; Completed no  Vision Screening: Recommended annual ophthalmology exams for early detection of glaucoma and other disorders of the eye. Is the patient up to date with their annual eye exam?  Yes  Who is the provider or what is the name of the office in which the patient attends annual eye exams? [facility] If pt is not established with a  provider, would they like to be referred to a provider to establish care?  No .   Dental Screening: Recommended annual dental exams for proper oral hygiene  Community Resource Referral / Chronic Care Management: CRR required this visit?  No   CCM required this visit?  No      Plan:     I have personally reviewed and noted the following in the patient's chart:   Medical and social history Use of alcohol, tobacco or illicit drugs  Current medications and supplements including opioid prescriptions.  Functional ability and status Nutritional status Physical activity Advanced directives List of other physicians Hospitalizations, surgeries, and ER visits in previous 12 months Vitals Screenings to include cognitive, depression, and falls Referrals and appointments  In addition, I have reviewed and discussed with patient certain preventive protocols, quality metrics, and best practice recommendations. A written personalized care plan for preventive services as well as general preventive health recommendations were provided to patient.     Sheral Flow, LPN   [id number]   Nurse Notes: n/a

## 2021-04-03 NOTE — Patient Instructions (Signed)
Ms. Vessel , Thank you for taking time to come for your Medicare Wellness Visit. I appreciate your ongoing commitment to your health goals. Please review the following plan we discussed and let me know if I can assist you in the future.   Screening recommendations/referrals: Colonoscopy: not a candidate for colon cancer screening due to age. Mammogram: last done 05/09/2020; due every year Bone Density: last done 03/30/2020; due every 2 years Recommended yearly ophthalmology/optometry visit for glaucoma screening and checkup Recommended yearly dental visit for hygiene and checkup  Vaccinations: Influenza vaccine: 05/2020; due Fall 2022 Pneumococcal vaccine: 02/08/2014, 02/21/2018 Tdap vaccine: 02/15/2015; due every 10 years Shingles vaccine: 11/16/2020; need second dose   Covid-19: 10/30/2019, 11/27/2019, 08/05/2020  Advanced directives: Please bring a copy of your health care power of attorney and living will to the office at your convenience.  Conditions/risks identified: My goal is to lose 15 pounds by watching my diet and being more physically active.  Next appointment: Please schedule your next Medicare Wellness Visit with your Nurse Health Advisor in 1 year by calling [phone].   Preventive Care 80 Years and Older, Female Preventive care refers to lifestyle choices and visits with your health care provider that can promote health and wellness. What does preventive care include? A yearly physical exam. This is also called an annual well check. Dental exams once or twice a year. Routine eye exams. Ask your health care provider how often you should have your eyes checked. Personal lifestyle choices, including: Daily care of your teeth and gums. Regular physical activity. Eating a healthy diet. Avoiding tobacco and drug use. Limiting alcohol use. Practicing safe sex. Taking low-dose aspirin every day. Taking vitamin and mineral supplements as recommended by your health care  provider. What happens during an annual well check? The services and screenings done by your health care provider during your annual well check will depend on your age, overall health, lifestyle risk factors, and family history of disease. Counseling  Your health care provider may ask you questions about your: Alcohol use. Tobacco use. Drug use. Emotional well-being. Home and relationship well-being. Sexual activity. Eating habits. History of falls. Memory and ability to understand (cognition). Work and work Statistician. Reproductive health. Screening  You may have the following tests or measurements: Height, weight, and BMI. Blood pressure. Lipid and cholesterol levels. These may be checked every 5 years, or more frequently if you are over 46 years old. Skin check. Lung cancer screening. You may have this screening every year starting at age 22 if you have a 30-pack-year history of smoking and currently smoke or have quit within the past 15 years. Fecal occult blood test (FOBT) of the stool. You may have this test every year starting at age 80. Flexible sigmoidoscopy or colonoscopy. You may have a sigmoidoscopy every 5 years or a colonoscopy every 10 years starting at age 84. Hepatitis C blood test. Hepatitis B blood test. Sexually transmitted disease (STD) testing. Diabetes screening. This is done by checking your blood sugar (glucose) after you have not eaten for a while (fasting). You may have this done every 1-3 years. Bone density scan. This is done to screen for osteoporosis. You may have this done starting at age 80. Mammogram. This may be done every 1-2 years. Talk to your health care provider about how often you should have regular mammograms. Talk with your health care provider about your test results, treatment options, and if necessary, the need for more tests. Vaccines  Your health care  provider may recommend certain vaccines, such as: Influenza vaccine. This is  recommended every year. Tetanus, diphtheria, and acellular pertussis (Tdap, Td) vaccine. You may need a Td booster every 10 years. Zoster vaccine. You may need this after age 80. Pneumococcal 13-valent conjugate (PCV13) vaccine. One dose is recommended after age 80. Pneumococcal polysaccharide (PPSV23) vaccine. One dose is recommended after age 80. Talk to your health care provider about which screenings and vaccines you need and how often you need them. This information is not intended to replace advice given to you by your health care provider. Make sure you discuss any questions you have with your health care provider. Document Released: 10/21/2015 Document Revised: 06/13/2016 Document Reviewed: 07/26/2015 Elsevier Interactive Patient Education  2017 [place] Prevention in the Home Falls can cause injuries. They can happen to people of all ages. There are many things you can do to make your home safe and to help prevent falls. What can I do on the outside of my home? Regularly fix the edges of walkways and driveways and fix any cracks. Remove anything that might make you trip as you walk through a door, such as a raised step or threshold. Trim any bushes or trees on the path to your home. Use bright outdoor lighting. Clear any walking paths of anything that might make someone trip, such as rocks or tools. Regularly check to see if handrails are loose or broken. Make sure that both sides of any steps have handrails. Any raised decks and porches should have guardrails on the edges. Have any leaves, snow, or ice cleared regularly. Use sand or salt on walking paths during winter. Clean up any spills in your garage right away. This includes oil or grease spills. What can I do in the bathroom? Use night lights. Install grab bars by the toilet and in the tub and shower. Do not use towel bars as grab bars. Use non-skid mats or decals in the tub or shower. If you need to sit down in  the shower, use a plastic, non-slip stool. Keep the floor dry. Clean up any water that spills on the floor as soon as it happens. Remove soap buildup in the tub or shower regularly. Attach bath mats securely with double-sided non-slip rug tape. Do not have throw rugs and other things on the floor that can make you trip. What can I do in the bedroom? Use night lights. Make sure that you have a light by your bed that is easy to reach. Do not use any sheets or blankets that are too big for your bed. They should not hang down onto the floor. Have a firm chair that has side arms. You can use this for support while you get dressed. Do not have throw rugs and other things on the floor that can make you trip. What can I do in the kitchen? Clean up any spills right away. Avoid walking on wet floors. Keep items that you use a lot in easy-to-reach places. If you need to reach something above you, use a strong step stool that has a grab bar. Keep electrical cords out of the way. Do not use floor polish or wax that makes floors slippery. If you must use wax, use non-skid floor wax. Do not have throw rugs and other things on the floor that can make you trip. What can I do with my stairs? Do not leave any items on the stairs. Make sure that there are handrails on both  sides of the stairs and use them. Fix handrails that are broken or loose. Make sure that handrails are as long as the stairways. Check any carpeting to make sure that it is firmly attached to the stairs. Fix any carpet that is loose or worn. Avoid having throw rugs at the top or bottom of the stairs. If you do have throw rugs, attach them to the floor with carpet tape. Make sure that you have a light switch at the top of the stairs and the bottom of the stairs. If you do not have them, ask someone to add them for you. What else can I do to help prevent falls? Wear shoes that: Do not have high heels. Have rubber bottoms. Are comfortable  and fit you well. Are closed at the toe. Do not wear sandals. If you use a stepladder: Make sure that it is fully opened. Do not climb a closed stepladder. Make sure that both sides of the stepladder are locked into place. Ask someone to hold it for you, if possible. Clearly mark and make sure that you can see: Any grab bars or handrails. First and last steps. Where the edge of each step is. Use tools that help you move around (mobility aids) if they are needed. These include: Canes. Walkers. Scooters. Crutches. Turn on the lights when you go into a dark area. Replace any light bulbs as soon as they burn out. Set up your furniture so you have a clear path. Avoid moving your furniture around. If any of your floors are uneven, fix them. If there are any pets around you, be aware of where they are. Review your medicines with your doctor. Some medicines can make you feel dizzy. This can increase your chance of falling. Ask your doctor what other things that you can do to help prevent falls. This information is not intended to replace advice given to you by your health care provider. Make sure you discuss any questions you have with your health care provider. Document Released: 07/21/2009 Document Revised: 03/01/2016 Document Reviewed: 10/29/2014 Elsevier Interactive Patient Education  2017 [employer].

## 2021-04-07 ENCOUNTER — Encounter: Payer: Self-pay | Admitting: Podiatry

## 2021-04-07 ENCOUNTER — Other Ambulatory Visit: Payer: Self-pay

## 2021-04-07 ENCOUNTER — Ambulatory Visit (INDEPENDENT_AMBULATORY_CARE_PROVIDER_SITE_OTHER): Payer: PPO

## 2021-04-07 ENCOUNTER — Ambulatory Visit (INDEPENDENT_AMBULATORY_CARE_PROVIDER_SITE_OTHER): Payer: PPO | Admitting: Podiatry

## 2021-04-07 DIAGNOSIS — M2042 Other hammer toe(s) (acquired), left foot: Secondary | ICD-10-CM

## 2021-04-07 DIAGNOSIS — Z9889 Other specified postprocedural states: Secondary | ICD-10-CM

## 2021-04-07 DIAGNOSIS — M2041 Other hammer toe(s) (acquired), right foot: Secondary | ICD-10-CM

## 2021-04-10 NOTE — Progress Notes (Signed)
Subjective:   Patient ID: Yolanda Grant, female   DOB: 80 y.o.   MRN: [id number]   HPI Patient states doing very well with surgery right foot not wearing dressing currently and using the splint   ROS      Objective:  Physical Exam  Neurovascular status intact negative Bevelyn Buckles' sign noted wound edges well coapted digit good alignment pin intact     Assessment:  Doing well post fusion digit to right     Plan:  H&P x-ray reviewed and pin removed sterile dressing applied x-ray reviewed and continue lowering the second toe with brace and patient is discharged will be seen back as needed  X-rays indicate good position of the second digit alignment good not elevated to a significant fashion

## 2021-04-12 ENCOUNTER — Other Ambulatory Visit: Payer: Self-pay

## 2021-04-12 ENCOUNTER — Telehealth: Payer: Self-pay | Admitting: Internal Medicine

## 2021-04-12 MED ORDER — RALOXIFENE HCL 60 MG PO TABS: 60.0000 mg | ORAL_TABLET | Freq: Every day | ORAL | 1 refills | Status: DC

## 2021-04-12 NOTE — Telephone Encounter (Signed)
Patient requesting order for raloxifene (EVISTA) 60 MG tablet  [place], [place]

## 2021-05-07 NOTE — Progress Notes (Signed)
Subjective:    Patient ID: Yolanda Grant, female    DOB: December 23, 1940, 80 y.o.   MRN: [id number]   This visit occurred during the SARS-CoV-2 public health emergency.  Safety protocols were in place, including screening questions prior to the visit, additional usage of staff PPE, and extensive cleaning of exam room while observing appropriate contact time as indicated for disinfecting solutions.    HPI She is here for a physical exam.   Hammer toe surgery may on right foot.  R > L LE edema - that is new. Just starting to exercise some.  Watching her salt intake. Elevating legs.     Medications and allergies reviewed with patient and updated if appropriate.  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     Biotin 10000 MCG TABS Take 10,000 mcg by mouth in the morning.     Calcium Carb-Cholecalciferol (CALCIUM 1000 + D PO) Take 1,000 mg by mouth in the morning.     Calcium Carbonate-Vitamin D3 600-400 MG-UNIT TABS Take by mouth. 600 mg/ 800 if vitamin D3     Cranberry 500 MG CAPS Take 500 mg by mouth every evening.     D-Mannose POWD Take by mouth.     metoprolol succinate (TOPROL-XL) 50 MG 24 hr tablet Take 1 tablet (50 mg total) by mouth daily. Take one table by mouth daily 90 tablet 1   Multiple Vitamin (ONE-DAILY MULTI-VITAMIN) TABS Take by mouth.      OMEPRAZOLE PO Take 20 mg by mouth.     pravastatin (PRAVACHOL) 20 MG tablet Take 1 tablet (20 mg total) by mouth daily. 90 tablet 3   Probiotic Product (PROBIOTIC PO) Take by mouth.     Psyllium (METAMUCIL FIBER) 51.7 % PACK Take by mouth.     raloxifene (EVISTA) 60 MG tablet Take 1 tablet (60 mg total) by mouth daily. 90 tablet 1   Turmeric 500 MG CAPS Take by mouth.     Zinc 50 MG CAPS Take by mouth.     No current facility-administered medications on file prior to visit.    Past Medical History:  Diagnosis Date   Aneurysm ([place])    Arrhythmia    Congenital spondylolisthesis of lumbar region    Hyperlipidemia    Irregular heartbeat    Mitral valve prolapse    Spinal stenosis     Past Surgical History:  Procedure Laterality Date   ABDOMINAL HYSTERECTOMY     anemia, heavy bleeding   BREAST RECONSTRUCTION WITH PLACEMENT OF TISSUE EXPANDER AND ALLODERM     TUBAL LIGATION      Social History   Socioeconomic History   Marital status: Widowed    Spouse name: Not on file   Number of children: Not on file   Years of education: Not on file   Highest education level: Not on file  Occupational History   Not on file  Tobacco Use   Smoking status: Never   Smokeless tobacco: Never  Substance and Sexual Activity   Alcohol use: Not Currently   Drug use: Never   Sexual activity: Not on file  Other Topics Concern   Not on file  Social History Narrative   Not on file   Social Determinants of Health   Financial Resource Strain: Low Risk    Difficulty of Paying Living Expenses: Not hard at all  Food Insecurity: No Food Insecurity   Worried About Running Out of Food in the Last Year: Never true   [place] in the Last Year: Never true  Transportation Needs: No Transportation Needs   Lack of Transportation (Medical): No   Lack of Transportation (Non-Medical): No  Physical Activity: Sufficiently Active   Days of Exercise per Week: 5 days   Minutes of Exercise per  Session: 30 min  Stress: No Stress Concern Present   Feeling of Stress : Not at all  Social Connections: Moderately Integrated   Frequency of Communication with Friends and Family: More than three times a week   Frequency of Social Gatherings with Friends and Family: More than three times a week   Attends Religious Services: More than 4 times per year   Active Member of [employer] or Organizations: Yes   Attends Archivist Meetings: More than 4 times per year   Marital Status: Widowed    Family History  Problem Relation Age of Onset   Breast cancer Mother     Review of Systems  Constitutional:  Negative for chills and fever.  Eyes:  Negative for visual disturbance.  Respiratory:  Negative for cough, shortness of breath and wheezing.   Cardiovascular:  Positive for palpitations (when laying down) and leg swelling. Negative for chest pain.  Gastrointestinal:  Negative for abdominal pain, blood in stool, constipation, diarrhea and nausea.       No gerd  Genitourinary:  Negative for dysuria.  Musculoskeletal:  Positive for back pain (lower back pain). Negative for arthralgias.  Skin:  Negative for color change and rash.  Neurological:  Negative for light-headedness and headaches.  Psychiatric/Behavioral:  Negative for dysphoric mood. The patient is not nervous/anxious.       Objective:   Vitals:   05/08/21 1013  BP: 130/70  Pulse: 60  Temp: 98 F (36.7 C)  SpO2: 99%   Filed Weights   05/08/21 1013  Weight: 159 lb 3.2 oz (72.2 kg)   Body mass index is 26.49 kg/m.  BP Readings from Last 3 Encounters:  05/08/21 130/70  04/03/21 110/60  12/16/20 126/74    Wt Readings from Last 3 Encounters:  05/08/21 159 lb 3.2 oz (72.2 kg)  04/03/21 159 lb 12.8 oz (72.5 kg)  12/16/20 157 lb 3.2 oz (71.3 kg)     Physical Exam Constitutional: She appears well-developed and well-nourished. No distress.  HENT:  Head: Normocephalic and atraumatic.  Right Ear: External ear  normal. Normal ear canal and TM Left Ear: External ear normal.  Normal ear canal and TM Mouth/Throat: Oropharynx is clear and moist.  Eyes: Conjunctivae and EOM are normal.  Neck: Neck supple. No tracheal deviation present. No thyromegaly present.  No carotid bruit  Cardiovascular: Normal rate, regular rhythm and normal heart sounds.   No murmur heard.  No edema. Few small varicose veins in b/l LE Pulmonary/Chest: Effort normal and breath sounds normal. No respiratory distress. She has no wheezes.  She has no rales.  Breast: deferred   Abdominal: Soft. She exhibits no distension. There is no tenderness.  Lymphadenopathy: She has no cervical adenopathy.  Skin: Skin is warm and dry. She is not diaphoretic.  Psychiatric: She has a normal mood and affect. Her behavior is normal.     No results found for: WBC, HGB, HCT, PLT, GLUCOSE, CHOL, TRIG, HDL, LDLDIRECT, LDLCALC, ALT, AST, NA, K, CL, CREATININE, BUN, CO2, TSH, PSA, INR, GLUF, HGBA1C, MICROALBUR       Assessment & Plan:   Physical exam: Screening blood work  ordered Exercise  typically regular - just starting to get back to walking since toe surgery Weight  good Substance abuse  none   Reviewed recommended immunizations.  Will get second shingrix   Health Maintenance  Topic Date Due   COVID-19 Vaccine (3 - Moderna risk series) 12/25/2019   Zoster Vaccines- Shingrix (2 of 2) 01/11/2021   INFLUENZA VACCINE  05/08/2021   TETANUS/TDAP  02/14/2025   DEXA SCAN  Completed   PNA vac Low Risk Adult  Completed   HPV VACCINES  Aged Out          See Problem List for Assessment and Plan of chronic medical problems.

## 2021-05-07 NOTE — Patient Instructions (Addendum)
Blood work was ordered.     Medications changes include :   none  Your prescription(s) have been submitted to your pharmacy. Please take as directed and contact our office if you believe you are having problem(s) with the medication(s).    Please followup in 1 year    Health Maintenance, Female Adopting a healthy lifestyle and getting preventive care are important in promoting health and wellness. Ask your health care provider about: The right schedule for you to have regular tests and exams. Things you can do on your own to prevent diseases and keep yourself healthy. What should I know about diet, weight, and exercise? Eat a healthy diet  Eat a diet that includes plenty of vegetables, fruits, low-fat dairy products, and lean protein. Do not eat a lot of foods that are high in solid fats, added sugars, or sodium.  Maintain a healthy weight Body mass index (BMI) is used to identify weight problems. It estimates body fat based on height and weight. Your health care provider can help determineyour BMI and help you achieve or maintain a healthy weight. Get regular exercise Get regular exercise. This is one of the most important things you can do for your health. Most adults should: Exercise for at least 150 minutes each week. The exercise should increase your heart rate and make you sweat (moderate-intensity exercise). Do strengthening exercises at least twice a week. This is in addition to the moderate-intensity exercise. Spend less time sitting. Even light physical activity can be beneficial. Watch cholesterol and blood lipids Have your blood tested for lipids and cholesterol at 80 years of age, then havethis test every 5 years. Have your cholesterol levels checked more often if: Your lipid or cholesterol levels are high. You are older than 80 years of age. You are at high risk for heart disease. What should I know about cancer screening? Depending on your health history and  family history, you may need to have cancer screening at various ages. This may include screening for: Breast cancer. Cervical cancer. Colorectal cancer. Skin cancer. Lung cancer. What should I know about heart disease, diabetes, and high blood pressure? Blood pressure and heart disease High blood pressure causes heart disease and increases the risk of stroke. This is more likely to develop in people who have high blood pressure readings, are of African descent, or are overweight. Have your blood pressure checked: Every 3-5 years if you are 48-2 years of age. Every year if you are 68 years old or older. Diabetes Have regular diabetes screenings. This checks your fasting blood sugar level. Have the screening done: Once every three years after age 12 if you are at a normal weight and have a low risk for diabetes. More often and at a younger age if you are overweight or have a high risk for diabetes. What should I know about preventing infection? Hepatitis B If you have a higher risk for hepatitis B, you should be screened for this virus. Talk with your health care provider to find out if you are at risk forhepatitis B infection. Hepatitis C Testing is recommended for: Everyone born from [birth year] through 1965. Anyone with known risk factors for hepatitis C. Sexually transmitted infections (STIs) Get screened for STIs, including gonorrhea and chlamydia, if: You are sexually active and are younger than 80 years of age. You are older than 80 years of age and your health care provider tells you that you are at risk for this type of infection. Your sexual  activity has changed since you were last screened, and you are at increased risk for chlamydia or gonorrhea. Ask your health care provider if you are at risk. Ask your health care provider about whether you are at high risk for HIV. Your health care provider may recommend a prescription medicine to help prevent HIV infection. If you choose to take  medicine to prevent HIV, you should first get tested for HIV. You should then be tested every 3 months for as long as you are taking the medicine. Pregnancy If you are about to stop having your period (premenopausal) and you may become pregnant, seek counseling before you get pregnant. Take 400 to 800 micrograms (mcg) of folic acid every day if you become pregnant. Ask for birth control (contraception) if you want to prevent pregnancy. Osteoporosis and menopause Osteoporosis is a disease in which the bones lose minerals and strength with aging. This can result in bone fractures. If you are 48 years old or older, or if you are at risk for osteoporosis and fractures, ask your health care provider if you should: Be screened for bone loss. Take a calcium or vitamin D supplement to lower your risk of fractures. Be given hormone replacement therapy (HRT) to treat symptoms of menopause. Follow these instructions at home: Lifestyle Do not use any products that contain nicotine or tobacco, such as cigarettes, e-cigarettes, and chewing tobacco. If you need help quitting, ask your health care provider. Do not use street drugs. Do not share needles. Ask your health care provider for help if you need support or information about quitting drugs. Alcohol use Do not drink alcohol if: Your health care provider tells you not to drink. You are pregnant, may be pregnant, or are planning to become pregnant. If you drink alcohol: Limit how much you use to 0-1 drink a day. Limit intake if you are breastfeeding. Be aware of how much alcohol is in your drink. In the [place] one drink equals one 12 oz bottle of beer (355 mL), one 5 oz glass of wine (148 mL), or one 1 oz glass of hard liquor (44 mL). General instructions Schedule regular health, dental, and eye exams. Stay current with your vaccines. Tell your health care provider if: You often feel depressed. You have ever been abused or do not feel safe at  home. Summary Adopting a healthy lifestyle and getting preventive care are important in promoting health and wellness. Follow your health care provider's instructions about healthy diet, exercising, and getting tested or screened for diseases. Follow your health care provider's instructions on monitoring your cholesterol and blood pressure. This information is not intended to replace advice given to you by your health care provider. Make sure you discuss any questions you have with your healthcare provider. Document Revised: 09/17/2018 Document Reviewed: 09/17/2018 Elsevier Patient Education  2022 [employer].

## 2021-05-08 ENCOUNTER — Other Ambulatory Visit: Payer: Self-pay

## 2021-05-08 ENCOUNTER — Encounter: Payer: Self-pay | Admitting: Internal Medicine

## 2021-05-08 ENCOUNTER — Ambulatory Visit (INDEPENDENT_AMBULATORY_CARE_PROVIDER_SITE_OTHER): Payer: PPO | Admitting: Internal Medicine

## 2021-05-08 VITALS — BP 130/70 | HR 60 | Temp 98.0°F | Ht 65.0 in | Wt 159.2 lb

## 2021-05-08 DIAGNOSIS — Z0000 Encounter for general adult medical examination without abnormal findings: Secondary | ICD-10-CM | POA: Diagnosis not present

## 2021-05-08 DIAGNOSIS — I471 Supraventricular tachycardia: Secondary | ICD-10-CM | POA: Diagnosis not present

## 2021-05-08 DIAGNOSIS — E782 Mixed hyperlipidemia: Secondary | ICD-10-CM

## 2021-05-08 DIAGNOSIS — M810 Age-related osteoporosis without current pathological fracture: Secondary | ICD-10-CM

## 2021-05-08 DIAGNOSIS — K219 Gastro-esophageal reflux disease without esophagitis: Secondary | ICD-10-CM

## 2021-05-08 DIAGNOSIS — Z1231 Encounter for screening mammogram for malignant neoplasm of breast: Secondary | ICD-10-CM

## 2021-05-08 LAB — LIPID PANEL
Cholesterol: 198 mg/dL (ref 0–200)
HDL: 59.8 mg/dL (ref >39.00)
LDL Cholesterol: 106 mg/dL — ABNORMAL HIGH (ref 0–99)
NonHDL: 138.63
Total CHOL/HDL Ratio: 3
Triglycerides: 161 mg/dL — ABNORMAL HIGH (ref 0.0–149.0)
VLDL: 32.2 mg/dL (ref 0.0–40.0)

## 2021-05-08 LAB — COMPREHENSIVE METABOLIC PANEL
ALT: 17 U/L (ref 0–35)
AST: 21 U/L (ref 0–37)
Albumin: 4.1 g/dL (ref 3.5–5.2)
Alkaline Phosphatase: 71 U/L (ref 39–117)
BUN: 11 mg/dL (ref 6–23)
CO2: 28 mEq/L (ref 19–32)
Calcium: 9.4 mg/dL (ref 8.4–10.5)
Chloride: 105 mEq/L (ref 96–112)
Creatinine, Ser: 0.75 mg/dL (ref 0.40–1.20)
GFR: 75.15 mL/min (ref >60.00)
Glucose, Bld: 82 mg/dL (ref 70–99)
Potassium: 4.2 mEq/L (ref 3.5–5.1)
Sodium: 141 mEq/L (ref 135–145)
Total Bilirubin: 0.8 mg/dL (ref 0.2–1.2)
Total Protein: 7 g/dL (ref 6.0–8.3)

## 2021-05-08 LAB — CBC WITH DIFFERENTIAL/PLATELET
Basophils Absolute: 0 10*3/uL (ref 0.0–0.1)
Basophils Relative: 0.2 % (ref 0.0–3.0)
Eosinophils Absolute: 0 10*3/uL (ref 0.0–0.7)
Eosinophils Relative: 0.5 % (ref 0.0–5.0)
HCT: 40.8 % (ref 36.0–46.0)
Hemoglobin: 13.2 g/dL (ref 12.0–15.0)
Lymphocytes Relative: 35.7 % (ref 12.0–46.0)
Lymphs Abs: 1.7 10*3/uL (ref 0.7–4.0)
MCHC: 32.4 g/dL (ref 30.0–36.0)
MCV: 93.9 fl (ref 78.0–100.0)
Monocytes Absolute: 0.4 10*3/uL (ref 0.1–1.0)
Monocytes Relative: 8.8 % (ref 3.0–12.0)
Neutro Abs: 2.7 10*3/uL (ref 1.4–7.7)
Neutrophils Relative %: 54.8 % (ref 43.0–77.0)
Platelets: 230 10*3/uL (ref 150.0–400.0)
RBC: 4.34 Mil/uL (ref 3.87–5.11)
RDW: 14.8 % (ref 11.5–15.5)
WBC: 4.9 10*3/uL (ref 4.0–10.5)

## 2021-05-08 LAB — VITAMIN D 25 HYDROXY (VIT D DEFICIENCY, FRACTURES): VITD: 89.98 ng/mL (ref 30.00–100.00)

## 2021-05-08 LAB — TSH: TSH: 1.52 u[IU]/mL (ref 0.35–5.50)

## 2021-05-08 MED ORDER — METOPROLOL SUCCINATE ER 50 MG PO TB24: 50.0000 mg | ORAL_TABLET | Freq: Every day | ORAL | 1 refills | Status: DC

## 2021-05-08 MED ORDER — RALOXIFENE HCL 60 MG PO TABS: 60.0000 mg | ORAL_TABLET | Freq: Every day | ORAL | 1 refills | Status: DC

## 2021-05-08 NOTE — Assessment & Plan Note (Signed)
Chronic Check lipid panel, CMP Continue pravastatin 20 mg daily Regular exercise and healthy diet encouraged

## 2021-05-08 NOTE — Assessment & Plan Note (Signed)
Chronic Continue a visa 60 mg daily Continue calcium and vitamin D Walks regularly DEXA up-to-date Check vitamin D level

## 2021-05-08 NOTE — Assessment & Plan Note (Addendum)
Chronic Following with cardiology-Dr. Curt Bears Controlled Continue metoprolol XL 50 mg daily TSH, CBC, CMP

## 2021-05-08 NOTE — Assessment & Plan Note (Signed)
Chronic gerd controlled Taking it every other day -- advised to try tapering off of it

## 2021-05-23 ENCOUNTER — Telehealth: Payer: Self-pay

## 2021-05-23 NOTE — Telephone Encounter (Signed)
Patient wanted to see if there is a reason the breast center has not reached out to her yet for her mammogram.   She said she discuss having one done during her physical.

## 2021-06-03 ENCOUNTER — Ambulatory Visit
Admission: RE | Admit: 2021-06-03 | Discharge: 2021-06-03 | Disposition: A | Discharge status: Home / Self Care | Payer: PPO | Source: Ambulatory Visit | Attending: Internal Medicine | Admitting: Internal Medicine

## 2021-06-03 DIAGNOSIS — Z1231 Encounter for screening mammogram for malignant neoplasm of breast: Secondary | ICD-10-CM | POA: Diagnosis not present

## 2021-07-31 ENCOUNTER — Ambulatory Visit (HOSPITAL_COMMUNITY)
Admission: EM | Admit: 2021-07-31 | Discharge: 2021-07-31 | Disposition: A | Discharge status: Home / Self Care | Payer: PPO | Attending: Family Medicine | Admitting: Family Medicine

## 2021-07-31 ENCOUNTER — Encounter (HOSPITAL_COMMUNITY): Payer: Self-pay | Admitting: Emergency Medicine

## 2021-07-31 ENCOUNTER — Other Ambulatory Visit: Payer: Self-pay

## 2021-07-31 DIAGNOSIS — J069 Acute upper respiratory infection, unspecified: Secondary | ICD-10-CM | POA: Diagnosis not present

## 2021-07-31 MED ORDER — BENZONATATE 100 MG PO CAPS: ORAL_CAPSULE | ORAL | 0 refills | Status: DC

## 2021-07-31 NOTE — ED Provider Notes (Signed)
[facility]   [id number] 07/31/21 Arrival Time: 1100  ASSESSMENT & PLAN:  1. Viral URI with cough    Feeling better today. Discussed typical duration of viral illnesses. OTC symptom care as needed.  Meds ordered this encounter  Medications   benzonatate (TESSALON) 100 MG capsule    Sig: Take 1 capsule by mouth every 8 (eight) hours for cough.    Dispense:  21 capsule    Refill:  0     Follow-up Information     Binnie Rail, MD .   Specialty: Internal Medicine Why: As needed. Contact information: [place] [address] [phone]                 Reviewed expectations re: course of current medical issues. Questions answered. Outlined signs and symptoms indicating need for more acute intervention. Understanding verbalized. After Visit Summary given.   SUBJECTIVE: History from: patient. Yolanda Grant is a 80 y.o. female who reports: cough, nasal/chest congestion, body aches; abrupt onset; little less than a week. Feeling better today. Home COVID test negative. Cough does affect sleep. Denies: fever. Normal PO intake without n/v/d.   OBJECTIVE:  Vitals:   07/31/21 1114  BP: 111/69  Pulse: 68  Resp: 18  Temp: 98.1 F (36.7 C)  TempSrc: Oral  SpO2: 98%    General appearance: alert; no distress Eyes: PERRLA; EOMI; conjunctiva normal HENT: [place]; AT; with mild nasal congestion Neck: supple  Lungs: speaks full sentences without difficulty; unlabored; CTAB Extremities: no edema Skin: warm and dry Neurologic: normal gait Psychological: alert and cooperative; normal mood and affect   Allergies  Allergen Reactions   Sulfa Antibiotics Other (See Comments)    Blisters in mouth and throat Mouth and throat blisters    Augmentin [Amoxicillin-Pot Clavulanate]    Elemental Sulfur     Past Medical History:  Diagnosis Date   Aneurysm ([place])    Arrhythmia    Congenital spondylolisthesis of lumbar region    Hyperlipidemia     Irregular heartbeat    Mitral valve prolapse    Spinal stenosis    Social History   Socioeconomic History   Marital status: Widowed    Spouse name: Not on file   Number of children: Not on file   Years of education: Not on file   Highest education level: Not on file  Occupational History   Not on file  Tobacco Use   Smoking status: Never   Smokeless tobacco: Never  Substance and Sexual Activity   Alcohol use: Not Currently   Drug use: Never   Sexual activity: Not on file  Other Topics Concern   Not on file  Social History Narrative   Not on file   Social Determinants of Health   Financial Resource Strain: Low Risk    Difficulty of Paying Living Expenses: Not hard at all  Food Insecurity: No Food Insecurity   Worried About Running Out of Food in the Last Year: Never true   [place] in the Last Year: Never true  Transportation Needs: No Transportation Needs   Lack of Transportation (Medical): No   Lack of Transportation (Non-Medical): No  Physical Activity: Sufficiently Active   Days of Exercise per Week: 5 days   Minutes of Exercise per Session: 30 min  Stress: No Stress Concern Present   Feeling of Stress : Not at all  Social Connections: Moderately Integrated   Frequency of Communication with Friends and Family: More than  three times a week   Frequency of Social Gatherings with Friends and Family: More than three times a week   Attends Religious Services: More than 4 times per year   Active Member of Clubs or Organizations: Yes   Attends Archivist Meetings: More than 4 times per year   Marital Status: Widowed  Human resources officer Violence: Not At Risk   Fear of Current or Ex-Partner: No   Emotionally Abused: No   Physically Abused: No   Sexually Abused: No   Family History  Problem Relation Age of Onset   Breast cancer Mother    Past Surgical History:  Procedure Laterality Date   ABDOMINAL HYSTERECTOMY     anemia, heavy bleeding    AUGMENTATION MAMMAPLASTY     BREAST BIOPSY     BREAST RECONSTRUCTION WITH PLACEMENT OF TISSUE EXPANDER AND ALLODERM     TUBAL LIGATION       Vanessa Kick, MD 07/31/21 1229

## 2021-07-31 NOTE — ED Triage Notes (Signed)
Pt had flu shot last Wed and since Thursday had cough, congestion, sore throat and body aches. Home covid test was negative

## 2021-08-08 ENCOUNTER — Encounter: Payer: Self-pay | Admitting: Internal Medicine

## 2021-08-08 ENCOUNTER — Ambulatory Visit (INDEPENDENT_AMBULATORY_CARE_PROVIDER_SITE_OTHER): Payer: PPO | Admitting: Internal Medicine

## 2021-08-08 ENCOUNTER — Ambulatory Visit (INDEPENDENT_AMBULATORY_CARE_PROVIDER_SITE_OTHER): Payer: PPO

## 2021-08-08 ENCOUNTER — Other Ambulatory Visit: Payer: Self-pay

## 2021-08-08 VITALS — BP 116/72 | HR 88 | Temp 99.7°F | Ht 65.0 in | Wt 159.0 lb

## 2021-08-08 DIAGNOSIS — R062 Wheezing: Secondary | ICD-10-CM

## 2021-08-08 DIAGNOSIS — J189 Pneumonia, unspecified organism: Secondary | ICD-10-CM | POA: Insufficient documentation

## 2021-08-08 DIAGNOSIS — R051 Acute cough: Secondary | ICD-10-CM | POA: Diagnosis not present

## 2021-08-08 DIAGNOSIS — R059 Cough, unspecified: Secondary | ICD-10-CM | POA: Diagnosis not present

## 2021-08-08 MED ORDER — HYDROCODONE BIT-HOMATROP MBR 5-1.5 MG/5ML PO SOLN: 5.0000 mL | Freq: Four times a day (QID) | ORAL | 0 refills | Status: DC | PRN

## 2021-08-08 MED ORDER — DOXYCYCLINE HYCLATE 100 MG PO TABS: 100.0000 mg | ORAL_TABLET | Freq: Two times a day (BID) | ORAL | 0 refills | Status: AC

## 2021-08-08 NOTE — Progress Notes (Signed)
Subjective:    Patient ID: Yolanda Grant, female    DOB: September 02, 1941, 80 y.o.   MRN: [id number]  This visit occurred during the SARS-CoV-2 public health emergency.  Safety protocols were in place, including screening questions prior to the visit, additional usage of staff PPE, and extensive cleaning of exam room while observing appropriate contact time as indicated for disinfecting solutions.    HPI The patient is here for an acute visit.  Her symptoms started almost 2 weeks ago.  She had gotten the flu shot the day before and then she started noticing throat irritation.  Her symptoms gradually got worse.  She was experiencing a cough and did go to urgent care just over 1 week ago.  She was told her lungs were clear and no tests were done.  They did prescribe her Ladona Ridgel, which have not helped.  Her symptoms continued to get worse.  She did try some Alka-Seltzer cold, but has not been taking much else.   She states decreased appetite, fever of 101.4 this morning, nasal congestion, right-sided ear pain at times, sinus pressure, cough that is mostly dry, but is wet sounding.  She has also had some mild headaches.  She denies any wheezing, shortness of breath or chest pain.  Medications and allergies reviewed with patient and updated if appropriate.  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     benzonatate (TESSALON) 100 MG capsule Take 1 capsule by mouth every 8 (eight) hours for cough. 21 capsule 0   Biotin 10000 MCG TABS Take 10,000 mcg by mouth in the morning.     Calcium Carb-Cholecalciferol (CALCIUM 1000 + D PO) Take 1,000 mg by mouth in the morning.     Calcium Carbonate-Vitamin D3 600-400 MG-UNIT TABS Take by mouth. 600 mg/ 800 if vitamin D3     Cranberry 500 MG CAPS Take 500 mg by mouth every evening.     D-Mannose POWD Take by mouth.     metoprolol succinate (TOPROL-XL) 50 MG 24 hr tablet Take 1 tablet (50 mg total) by mouth daily. Take one table by mouth daily 90 tablet 1   Multiple Vitamin (ONE-DAILY MULTI-VITAMIN) TABS Take by mouth.     OMEPRAZOLE PO Take 20 mg by mouth.     pravastatin (PRAVACHOL) 20 MG tablet Take 1 tablet (20 mg total) by mouth daily. 90 tablet 3   Probiotic Product (PROBIOTIC PO) Take by mouth.     Psyllium (METAMUCIL FIBER) 51.7 % PACK Take by mouth.     raloxifene (EVISTA) 60 MG tablet Take 1 tablet (60 mg total) by mouth daily. 90 tablet 1   Turmeric 500 MG CAPS Take by mouth.     Zinc 50 MG CAPS Take by mouth.     No current facility-administered medications on file prior to visit.    Past Medical  History:  Diagnosis Date   Aneurysm ([place])    Arrhythmia    Congenital spondylolisthesis of lumbar region    Hyperlipidemia    Irregular heartbeat    Mitral valve prolapse    Spinal stenosis     Past Surgical History:  Procedure Laterality Date   ABDOMINAL HYSTERECTOMY     anemia, heavy bleeding   AUGMENTATION MAMMAPLASTY     BREAST BIOPSY     BREAST RECONSTRUCTION WITH PLACEMENT OF TISSUE EXPANDER AND ALLODERM     TUBAL LIGATION      Social History   Socioeconomic History   Marital status: Widowed    Spouse name: Not on file   Number of children: Not on file   Years of education: Not on file   Highest education level: Not on file  Occupational History   Not on file  Tobacco Use    Smoking status: Never   Smokeless tobacco: Never  Substance and Sexual Activity   Alcohol use: Not Currently   Drug use: Never   Sexual activity: Not on file  Other Topics Concern   Not on file  Social History Narrative   Not on file   Social Determinants of Health   Financial Resource Strain: Low Risk    Difficulty of Paying Living Expenses: Not hard at all  Food Insecurity: No Food Insecurity   Worried About Running Out of Food in the Last Year: Never true   [place] in the Last Year: Never true  Transportation Needs: No Transportation Needs   Lack of Transportation (Medical): No   Lack of Transportation (Non-Medical): No  Physical Activity: Sufficiently Active   Days of Exercise per Week: 5 days   Minutes of Exercise per Session: 30 min  Stress: No Stress Concern Present   Feeling of Stress : Not at all  Social Connections: Moderately Integrated   Frequency of Communication with Friends and Family: More than three times a week   Frequency of Social Gatherings with Friends and Family: More than three times a week   Attends Religious Services: More than 4 times per year   Active Member of [employer] or Organizations: Yes   Attends Archivist Meetings: More than 4 times per year   Marital Status: Widowed    Family History  Problem Relation Age of Onset   Breast cancer Mother     Review of Systems  Constitutional:  Positive for appetite change and fever (101.4 this morning).  HENT:  Positive for congestion, ear pain (right - from cough), sinus pressure, sinus pain and voice change. Negative for sore throat.   Respiratory:  Positive for cough (wet sounding). Negative for shortness of breath and wheezing.   Cardiovascular:  Negative for chest pain.  Gastrointestinal:  Negative for diarrhea and nausea.  Musculoskeletal:  Positive for myalgias.  Neurological:  Positive for headaches (minimal). Negative for dizziness.      Objective:   Vitals:   08/08/21  1527  BP: 116/72  Pulse: 88  Temp: 99.7 F (37.6 C)  SpO2: 92%   BP Readings from Last 3 Encounters:  08/08/21 116/72  07/31/21 111/69  05/08/21 130/70   Wt Readings from Last 3 Encounters:  08/08/21 159 lb (72.1 kg)  05/08/21 159 lb 3.2 oz (72.2 kg)  04/03/21 159 lb 12.8 oz (72.5 kg)   Body mass index is 26.46 kg/m.   Physical Exam    GENERAL APPEARANCE: Appears stated age, well appearing, NAD EYES: conjunctiva clear, no  icterus HENT: bilateral tympanic membranes and ear canals normal, oropharynx with no erythema or exudates, trachea midline, no cervical or supraclavicular lymphadenopathy LUNGS: Unlabored breathing, good air entry bilaterally, rhonchi left lung > right side  CARDIOVASCULAR: Normal S1,S2 , no edema SKIN: Warm, dry   DG Chest 2 View CLINICAL DATA:  Cough.  Wheezing.  Symptoms for 4-5 days.  EXAM: CHEST - 2 VIEW  COMPARISON:  None.  FINDINGS: Cardiac silhouette is normal in size. No mediastinal or hilar masses. No evidence of adenopathy.  There are linear opacities in both lung bases consistent with atelectasis, although infection is not excluded. Remainder of the lungs is clear.  No pleural effusion or pneumothorax.  Skeletal structures are intact.  IMPRESSION: 1. Linear lung base opacities suspected to be atelectasis. Pneumonia is not excluded.  Electronically Signed   By: Lajean Manes M.D.   On: 08/08/2021 16:37    Assessment & Plan:    See Problem List for Assessment and Plan of chronic medical problems.

## 2021-08-08 NOTE — Assessment & Plan Note (Signed)
Acute Clinically most consistent with community-acquired pneumonia Chest x-ray shows some linear opacities, possible atelectasis-infection not ruled out, but given her clinical symptoms I think she does have pneumonia Doxycycline 100 mg twice daily x10 days Hydromet cough syrup 5 mils every 6 hours as needed-discussed that this could cause some drowsiness Advised Tylenol as needed, Alka-Seltzer cold medication if that helps Rest, fluids Advised for her to call if she is not improving

## 2021-08-08 NOTE — Patient Instructions (Signed)
    Have a chest xray today.   Start the antibiotic - doxycycline 100 mg twice day.  Use the cough syrup as needed.

## 2021-08-10 ENCOUNTER — Telehealth: Payer: Self-pay | Admitting: Internal Medicine

## 2021-08-10 NOTE — Telephone Encounter (Signed)
Spoke with patient today. 

## 2021-08-10 NOTE — Telephone Encounter (Signed)
Patient calling in requesting to speak w/ nurse  Patient says she was told to call nurse & give an update on her progress  Please call back [phone]

## 2021-08-11 ENCOUNTER — Ambulatory Visit: Payer: PPO | Admitting: Internal Medicine

## 2021-08-15 ENCOUNTER — Telehealth: Payer: Self-pay | Admitting: Internal Medicine

## 2021-08-15 NOTE — Telephone Encounter (Signed)
Pt. Is requesting a callback from [facility] about medications and last visit. No further information given.    Callback #- [phone]

## 2021-08-17 NOTE — Telephone Encounter (Signed)
Spoke with patient today.  She states she is doing and feeling much better today.  She will call us back should she take a turn for the worse.

## 2021-09-08 ENCOUNTER — Other Ambulatory Visit: Payer: Self-pay | Admitting: Internal Medicine

## 2021-10-04 ENCOUNTER — Other Ambulatory Visit: Payer: Self-pay | Admitting: Cardiology

## 2021-10-18 DIAGNOSIS — Z1283 Encounter for screening for malignant neoplasm of skin: Secondary | ICD-10-CM | POA: Diagnosis not present

## 2021-10-18 DIAGNOSIS — D225 Melanocytic nevi of trunk: Secondary | ICD-10-CM | POA: Diagnosis not present

## 2021-10-18 DIAGNOSIS — L821 Other seborrheic keratosis: Secondary | ICD-10-CM | POA: Diagnosis not present

## 2021-12-05 ENCOUNTER — Ambulatory Visit: Payer: PPO | Admitting: Cardiovascular Disease

## 2021-12-05 DIAGNOSIS — H43812 Vitreous degeneration, left eye: Secondary | ICD-10-CM | POA: Diagnosis not present

## 2021-12-05 DIAGNOSIS — H04123 Dry eye syndrome of bilateral lacrimal glands: Secondary | ICD-10-CM | POA: Diagnosis not present

## 2021-12-05 DIAGNOSIS — H2513 Age-related nuclear cataract, bilateral: Secondary | ICD-10-CM | POA: Diagnosis not present

## 2021-12-05 DIAGNOSIS — H31092 Other chorioretinal scars, left eye: Secondary | ICD-10-CM | POA: Diagnosis not present

## 2021-12-10 DIAGNOSIS — I341 Nonrheumatic mitral (valve) prolapse: Secondary | ICD-10-CM | POA: Insufficient documentation

## 2021-12-10 DIAGNOSIS — R002 Palpitations: Secondary | ICD-10-CM | POA: Insufficient documentation

## 2021-12-10 NOTE — Progress Notes (Signed)
?  ?Cardiology Office Note ? ? ?Date:  12/11/2021  ? ?IDLaurette Grant, DOB December 08, 1940, MRN [id number] ? ?PCP:  Binnie Rail, MD  ?Cardiologist:   Minus Breeding, MD ? ? ?Chief Complaint  ?Patient presents with  ? Mitral Valve Prolapse  ?   ?  ? ? ?  ?History of Present Illness: ?Yolanda Grant is a 81 y.o. female who presents for evaluation of mitral valve prolapse and mitral regurgitation.  She lived in [place] and moved here and did see Dr. Curt Bears last year.  She was seeing a cardiologist in [place] because of mitral valve prolapse.  I was able to review these records in Care Everywhere and see an echocardiogram.   The last one was in 2021.  There was mild mitral valve prolapse with mitral regurgitation.  There was also some mild to moderate aortic insufficiency.  There were no other significant valvular abnormalities and her ejection fraction was well-preserved.  There was no mention of left ventricular dysfunction.  Her LV size was said to be normal. ? ?She presents for follow-up.  She does get some palpitations at night particular when she goes to bed.  She feels it skipping.  A couple of times a year she will have a stronger faster or harder arrhythmia.  She does not have presyncope or syncope associated with this.  Last for several minutes and actually can wake her from her sleep.  However, she says these are infrequent and they go away spontaneously.  She might do some coughing to get rid of them.  She denies any shortness of breath.  She does do some activities around her house and takes care of her house and feels a little physical activity.  She did get a little short of breath climbing some long flights of stairs recently at the [facility].  This is not particularly unusual. ? ? ?Past Medical History:  ?Diagnosis Date  ? Congenital spondylolisthesis of lumbar region   ? Hyperlipidemia   ? Mitral valve prolapse   ? Spinal stenosis   ? ? ?Past Surgical History:  ?Procedure  Laterality Date  ? ABDOMINAL HYSTERECTOMY    ? anemia, heavy bleeding  ? AUGMENTATION MAMMAPLASTY    ? BREAST BIOPSY    ? BREAST RECONSTRUCTION WITH PLACEMENT OF TISSUE EXPANDER AND ALLODERM    ? TUBAL LIGATION    ? ? ? ?Current Outpatient Medications  ?Medication Sig Dispense Refill  ? Ascorbic Acid (VITAMIN C PLUS WILD ROSE HIPS PO) Take by mouth.    ? benzonatate (TESSALON) 100 MG capsule Take 1 capsule by mouth every 8 (eight) hours for cough. 21 capsule 0  ? Biotin 10000 MCG TABS Take 10,000 mcg by mouth in the morning.    ? Calcium Carb-Cholecalciferol (CALCIUM 1000 + D PO) Take 1,000 mg by mouth in the morning.    ? Calcium Carbonate-Vitamin D3 600-400 MG-UNIT TABS Take by mouth. 600 mg/ 800 if vitamin D3    ? Cranberry 500 MG CAPS Take 500 mg by mouth every evening.    ? D-Mannose POWD Take by mouth.    ? Multiple Vitamin (ONE-DAILY MULTI-VITAMIN) TABS Take by mouth.    ? Probiotic Product (PROBIOTIC PO) Take by mouth.    ? Psyllium (METAMUCIL FIBER) 51.7 % PACK Take by mouth.    ? raloxifene (EVISTA) 60 MG tablet Take 1 tablet (60 mg total) by mouth daily. 90 tablet 1  ? Turmeric 500 MG CAPS Take by mouth.    ?  Zinc 50 MG CAPS Take by mouth.    ? HYDROcodone bit-homatropine (HYDROMET) 5-1.5 MG/5ML syrup Take 5 mLs by mouth every 6 (six) hours as needed for cough. (Patient not taking: Reported on 12/11/2021) 120 mL 0  ? metoprolol succinate (TOPROL-XL) 50 MG 24 hr tablet Take 1 tablet (50 mg total) by mouth daily. Take with or immediately following a meal. 90 tablet 3  ? OMEPRAZOLE PO Take 20 mg by mouth. (Patient not taking: Reported on 12/11/2021)    ? pravastatin (PRAVACHOL) 20 MG tablet TAKE 1 TABLET (20 MG TOTAL) BY MOUTH DAILY. (Patient not taking: Reported on 12/11/2021) 90 tablet 0  ? ?No current facility-administered medications for this visit.  ? ? ?Allergies:   Sulfa antibiotics, Augmentin [amoxicillin-pot clavulanate], and Elemental sulfur  ? ? ?Social History:  The patient  reports that she has never  smoked. She has never used smokeless tobacco. She reports that she does not currently use alcohol. She reports that she does not use drugs.  ? ?Family History:  The patient's family history includes Breast cancer in her mother.  ? ? ?ROS:  Please see the history of present illness.   Otherwise, review of systems are positive for none.   All other systems are reviewed and negative.  ? ? ?PHYSICAL EXAM: ?VS:  BP 100/72   Pulse (!) 58   Ht '5\' 4"'$  (1.626 m)   Wt 160 lb 3.2 oz (72.7 kg)   SpO2 94%   BMI 27.50 kg/m?  , BMI Body mass index is 27.5 kg/m?. ?GENERAL:  Well appearing ?HEENT:  Pupils equal round and reactive, fundi not visualized, oral mucosa unremarkable ?NECK:  No jugular venous distention, waveform within normal limits, carotid upstroke brisk and symmetric, no bruits, no thyromegaly ?LYMPHATICS:  No cervical, inguinal adenopathy ?LUNGS:  Clear to auscultation bilaterally ?BACK:  No CVA tenderness ?CHEST:  Unremarkable ?HEART:  PMI not displaced or sustained,S1 and S2 within normal limits, no S3, no S4, no clicks, no rubs, no murmurs ?ABD:  Flat, positive bowel sounds normal in frequency in pitch, no bruits, no rebound, no guarding, no midline pulsatile mass, no hepatomegaly, no splenomegaly ?EXT:  2 plus pulses throughout, no edema, no cyanosis no clubbing ?SKIN:  No rashes no nodules ?NEURO:  Cranial nerves II through XII grossly intact, motor grossly intact throughout ?PSYCH:  Cognitively intact, oriented to person place and time ? ? ? ?EKG:  EKG is ordered today. ?The ekg ordered today demonstrates sinus rhythm, rate 62, left axis deviation, poor anterior R wave progression, no acute T wave changes. ? ? ?Recent Labs: ?05/08/2021: ALT 17; BUN 11; Creatinine, Ser 0.75; Hemoglobin 13.2; Platelets 230.0; Potassium 4.2; Sodium 141; TSH 1.52  ? ? ?Lipid Panel ?   ?Component Value Date/Time  ? CHOL 198 05/08/2021 1124  ? TRIG 161.0 (H) 05/08/2021 1124  ? HDL 59.80 05/08/2021 1124  ? CHOLHDL 3 05/08/2021 1124   ? VLDL 32.2 05/08/2021 1124  ? LDLCALC 106 (H) 05/08/2021 1124  ? ?  ? ?Wt Readings from Last 3 Encounters:  ?12/11/21 160 lb 3.2 oz (72.7 kg)  ?08/08/21 159 lb (72.1 kg)  ?05/08/21 159 lb 3.2 oz (72.2 kg)  ?  ? ? ?Other studies Reviewed: ?Additional studies/ records that were reviewed today include: Care Everywhere. ?Review of the above records demonstrates:  Please see elsewhere in the note.   ? ? ?ASSESSMENT AND PLAN: ? ?MVP: She did have this with her previous mild mitral regurgitation on echo in 2021.  I will follow this up with an echocardiogram. ? ?AI: This was noted to be mild also a few years ago.  I will order an echocardiogram. ? ?Abnormal EKG: She has an EKG that suggest previous anterior MI but there was no regional wall motion abnormality previously.  I looked back at the 2022 EKG and it was likewise poor anterior R wave progression.  No further work-up other than the echo above. ? ?Palpitations: I suspect PACs.  I will order a 2-week monitor to rule out atrial fibrillation. ? ? ?Current medicines are reviewed at length with the patient today.  The patient does not have concerns regarding medicines. ? ?The following changes have been made:  no change ? ?Labs/ tests ordered today include:  ? ?Orders Placed This Encounter  ?Procedures  ? LONG TERM MONITOR (3-14 DAYS)  ? EKG 12-Lead  ? ECHOCARDIOGRAM COMPLETE  ? ? ? ?Disposition:   FU with me in 1 year or sooner if needed ? ? ?Signed, ?Minus Breeding, MD  ?12/11/2021 4:39 PM    ?[place] ? ? ? ?

## 2021-12-11 ENCOUNTER — Ambulatory Visit: Payer: PPO | Admitting: Cardiology

## 2021-12-11 ENCOUNTER — Encounter: Payer: Self-pay | Admitting: Cardiology

## 2021-12-11 ENCOUNTER — Other Ambulatory Visit: Payer: Self-pay

## 2021-12-11 VITALS — BP 100/72 | HR 58 | Ht 64.0 in | Wt 160.2 lb

## 2021-12-11 DIAGNOSIS — R002 Palpitations: Secondary | ICD-10-CM | POA: Diagnosis not present

## 2021-12-11 DIAGNOSIS — I341 Nonrheumatic mitral (valve) prolapse: Secondary | ICD-10-CM | POA: Diagnosis not present

## 2021-12-11 MED ORDER — METOPROLOL SUCCINATE ER 50 MG PO TB24: 50.0000 mg | ORAL_TABLET | Freq: Every day | ORAL | 3 refills | Status: DC

## 2021-12-11 NOTE — Patient Instructions (Signed)
Medication Instructions:  ?Your physician recommends that you continue on your current medications as directed. Please refer to the Current Medication list given to you today.  ? ?*If you need a refill on your cardiac medications before your next appointment, please call your pharmacy* ? ?Lab Work: ?NONE  ? ?Testing/Procedures: ?Your physician has requested that you have an echocardiogram. Echocardiography is a painless test that uses sound waves to create images of your heart. It provides your doctor with information about the size and shape of your heart and how well your heart?s chambers and valves are working. This procedure takes approximately one hour. There are no restrictions for this procedure. ? ?14 DAY ZIO TO BE DONE IN MAY  ? ?Follow-Up: ?At [facility], you and your health needs are our priority.  As part of our continuing mission to provide you with exceptional heart care, we have created designated Provider Care Teams.  These Care Teams include your primary Cardiologist (physician) and Advanced Practice Providers (APPs -  Physician Assistants and Nurse Practitioners) who all work together to provide you with the care you need, when you need it. ? ?We recommend signing up for the patient portal called "MyChart".  Sign up information is provided on this After Visit Summary.  MyChart is used to connect with patients for Virtual Visits (Telemedicine).  Patients are able to view lab/test results, encounter notes, upcoming appointments, etc.  Non-urgent messages can be sent to your provider as well.   ?To learn more about what you can do with MyChart, go to [email or web address].   ? ?Your next appointment:   ?12 month(s) ? ?The format for your next appointment:   ?In Person ? ?Provider:   ?Minus Breeding, MD { ? ?Other Instructions ? ?ZIO XT- Long Term Monitor Instructions ? ?Your physician has requested you wear a ZIO patch monitor for 14 days.  ?This is a single patch monitor. [employer] supplies one  patch monitor per enrollment. Additional ?stickers are not available. Please do not apply patch if you will be having a Nuclear Stress Test,  ?Echocardiogram, Cardiac CT, MRI, or Chest Xray during the period you would be wearing the  ?monitor. The patch cannot be worn during these tests. You cannot remove and re-apply the  ?ZIO XT patch monitor.  ?Your ZIO patch monitor will be mailed 3 day USPS to your address on file. It may take 3-5 days  ?to receive your monitor after you have been enrolled.  ?Once you have received your monitor, please review the enclosed instructions. Your monitor  ?has already been registered assigning a specific monitor serial # to you. ? ?Billing and Patient Assistance Program Information ? ?We have supplied [employer] with any of your insurance information on file for billing purposes. ?[employer] offers a sliding scale Patient Assistance Program for patients that do not have  ?insurance, or whose insurance does not completely cover the cost of the ZIO monitor.  ?You must apply for the Patient Assistance Program to qualify for this discounted rate.  ?To apply, please call [employer] at [phone], select option 4, select option 2, ask to apply for  ?Patient Assistance Program. Theodore Demark will ask your household income, and how many people  ?are in your household. They will quote your out-of-pocket cost based on that information.  ?[employer] will also be able to set up a 30-month interest-free payment plan if needed. ? ?Applying the monitor ?  ?Shave hair from upper left chest.  ?Hold abrader disc by orange tab. Rub abrader  in 40 strokes over the upper left chest as  ?indicated in your monitor instructions.  ?Clean area with 4 enclosed alcohol pads. Let dry.  ?Apply patch as indicated in monitor instructions. Patch will be placed under collarbone on left  ?side of chest with arrow pointing upward.  ?Rub patch adhesive wings for 2 minutes. Remove white label marked "1". Remove the white  ?label marked  "2". Rub patch adhesive wings for 2 additional minutes.  ?While looking in a mirror, press and release button in center of patch. A small green light will  ?flash 3-4 times. This will be your only indicator that the monitor has been turned on.  ?Do not shower for the first 24 hours. You may shower after the first 24 hours.  ?Press the button if you feel a symptom. You will hear a small click. Record Date, Time and  ?Symptom in the Patient Logbook.  ?When you are ready to remove the patch, follow instructions on the last 2 pages of Patient  ?Logbook. Stick patch monitor onto the last page of Patient Logbook.  ?Place Patient Logbook in the blue and white box. Use locking tab on box and tape box closed  ?securely. The blue and white box has prepaid postage on it. Please place it in the mailbox as  ?soon as possible. Your physician should have your test results approximately 7 days after the  ?monitor has been mailed back to [facility].  ?Call [facility] at [phone] if you have questions regarding  ?your ZIO XT patch monitor. Call them immediately if you see an orange light blinking on your  ?monitor.  ?If your monitor falls off in less than 4 days, contact our Monitor department at [phone].  ?If your monitor becomes loose or falls off after 4 days call [employer] at [phone] for  ?suggestions on securing your monitor ? ?

## 2021-12-12 ENCOUNTER — Ambulatory Visit (INDEPENDENT_AMBULATORY_CARE_PROVIDER_SITE_OTHER): Payer: PPO

## 2021-12-12 DIAGNOSIS — R002 Palpitations: Secondary | ICD-10-CM

## 2021-12-12 NOTE — Progress Notes (Unsigned)
Enrolled for [employer] to mail a ZIO XT long term holter monitor , 02/05/2022,to the patients address on file. ?

## 2021-12-19 ENCOUNTER — Ambulatory Visit (HOSPITAL_COMMUNITY): Payer: PPO | Attending: Cardiovascular Disease

## 2021-12-19 ENCOUNTER — Other Ambulatory Visit: Payer: Self-pay

## 2021-12-19 DIAGNOSIS — I341 Nonrheumatic mitral (valve) prolapse: Secondary | ICD-10-CM | POA: Insufficient documentation

## 2021-12-19 DIAGNOSIS — R002 Palpitations: Secondary | ICD-10-CM | POA: Insufficient documentation

## 2021-12-19 LAB — ECHOCARDIOGRAM COMPLETE
Area-P 1/2: 4.07 cm2
P 1/2 time: 363 msec
S' Lateral: 2.6 cm

## 2021-12-25 ENCOUNTER — Other Ambulatory Visit: Payer: Self-pay | Admitting: *Deleted

## 2021-12-25 DIAGNOSIS — I359 Nonrheumatic aortic valve disorder, unspecified: Secondary | ICD-10-CM

## 2021-12-28 DIAGNOSIS — H2511 Age-related nuclear cataract, right eye: Secondary | ICD-10-CM | POA: Diagnosis not present

## 2021-12-28 HISTORY — PX: CATARACT EXTRACTION: SUR2

## 2022-01-22 DIAGNOSIS — R002 Palpitations: Secondary | ICD-10-CM

## 2022-02-06 DIAGNOSIS — H2512 Age-related nuclear cataract, left eye: Secondary | ICD-10-CM | POA: Diagnosis not present

## 2022-02-09 DIAGNOSIS — R002 Palpitations: Secondary | ICD-10-CM | POA: Diagnosis not present

## 2022-02-13 DIAGNOSIS — H2512 Age-related nuclear cataract, left eye: Secondary | ICD-10-CM | POA: Diagnosis not present

## 2022-02-13 HISTORY — PX: CATARACT EXTRACTION: SUR2

## 2022-04-04 ENCOUNTER — Ambulatory Visit (INDEPENDENT_AMBULATORY_CARE_PROVIDER_SITE_OTHER): Payer: PPO

## 2022-04-04 DIAGNOSIS — Z0000 Encounter for general adult medical examination without abnormal findings: Secondary | ICD-10-CM

## 2022-04-04 DIAGNOSIS — Z1239 Encounter for other screening for malignant neoplasm of breast: Secondary | ICD-10-CM

## 2022-04-04 DIAGNOSIS — Z13820 Encounter for screening for osteoporosis: Secondary | ICD-10-CM | POA: Diagnosis not present

## 2022-04-04 NOTE — Patient Instructions (Signed)
Yolanda Grant , Thank you for taking time to come for your Medicare Wellness Visit. I appreciate your ongoing commitment to your health goals. Please review the following plan we discussed and let me know if I can assist you in the future.   Screening recommendations/referrals: Colonoscopy: Discontinued due to age 81: 06/03/2021; due every year (order placed 04/04/2022) Bone Density: 03/30/2020; due every 2 years (order placed 04/04/2022) Recommended yearly ophthalmology/optometry visit for glaucoma screening and checkup Recommended yearly dental visit for hygiene and checkup  Vaccinations: Influenza vaccine: 07/26/2021 Pneumococcal vaccine: 02/08/2014, 02/21/2018 Tdap vaccine: 02/15/2015; due every 10 years Shingles vaccine: 11/16/2020, 02/15/2021   Covid-19: 10/30/2019, 11/27/2019, 08/05/2020  Advanced directives: Yes; Please bring a copy of your health care power of attorney and living will to the office at your convenience.  Conditions/risks identified: Yes  Next appointment: Please schedule your next Medicare Wellness Visit with your Nurse Health Advisor in 1 year by calling [phone].   Preventive Care 1 Years and Older, Female Preventive care refers to lifestyle choices and visits with your health care provider that can promote health and wellness. What does preventive care include? A yearly physical exam. This is also called an annual well check. Dental exams once or twice a year. Routine eye exams. Ask your health care provider how often you should have your eyes checked. Personal lifestyle choices, including: Daily care of your teeth and gums. Regular physical activity. Eating a healthy diet. Avoiding tobacco and drug use. Limiting alcohol use. Practicing safe sex. Taking low-dose aspirin every day. Taking vitamin and mineral supplements as recommended by your health care provider. What happens during an annual well check? The services and screenings done by your health  care provider during your annual well check will depend on your age, overall health, lifestyle risk factors, and family history of disease. Counseling  Your health care provider may ask you questions about your: Alcohol use. Tobacco use. Drug use. Emotional well-being. Home and relationship well-being. Sexual activity. Eating habits. History of falls. Memory and ability to understand (cognition). Work and work Statistician. Reproductive health. Screening  You may have the following tests or measurements: Height, weight, and BMI. Blood pressure. Lipid and cholesterol levels. These may be checked every 5 years, or more frequently if you are over 53 years old. Skin check. Lung cancer screening. You may have this screening every year starting at age 23 if you have a 30-pack-year history of smoking and currently smoke or have quit within the past 15 years. Fecal occult blood test (FOBT) of the stool. You may have this test every year starting at age 37. Flexible sigmoidoscopy or colonoscopy. You may have a sigmoidoscopy every 5 years or a colonoscopy every 10 years starting at age 13. Hepatitis C blood test. Hepatitis B blood test. Sexually transmitted disease (STD) testing. Diabetes screening. This is done by checking your blood sugar (glucose) after you have not eaten for a while (fasting). You may have this done every 1-3 years. Bone density scan. This is done to screen for osteoporosis. You may have this done starting at age 20. Mammogram. This may be done every 1-2 years. Talk to your health care provider about how often you should have regular mammograms. Talk with your health care provider about your test results, treatment options, and if necessary, the need for more tests. Vaccines  Your health care provider may recommend certain vaccines, such as: Influenza vaccine. This is recommended every year. Tetanus, diphtheria, and acellular pertussis (Tdap, Td) vaccine. You  may need a Td  booster every 10 years. Zoster vaccine. You may need this after age 11. Pneumococcal 13-valent conjugate (PCV13) vaccine. One dose is recommended after age 38. Pneumococcal polysaccharide (PPSV23) vaccine. One dose is recommended after age 57. Talk to your health care provider about which screenings and vaccines you need and how often you need them. This information is not intended to replace advice given to you by your health care provider. Make sure you discuss any questions you have with your health care provider. Document Released: 10/21/2015 Document Revised: 06/13/2016 Document Reviewed: 07/26/2015 Elsevier Interactive Patient Education  2017 [place] Prevention in the Home Falls can cause injuries. They can happen to people of all ages. There are many things you can do to make your home safe and to help prevent falls. What can I do on the outside of my home? Regularly fix the edges of walkways and driveways and fix any cracks. Remove anything that might make you trip as you walk through a door, such as a raised step or threshold. Trim any bushes or trees on the path to your home. Use bright outdoor lighting. Clear any walking paths of anything that might make someone trip, such as rocks or tools. Regularly check to see if handrails are loose or broken. Make sure that both sides of any steps have handrails. Any raised decks and porches should have guardrails on the edges. Have any leaves, snow, or ice cleared regularly. Use sand or salt on walking paths during winter. Clean up any spills in your garage right away. This includes oil or grease spills. What can I do in the bathroom? Use night lights. Install grab bars by the toilet and in the tub and shower. Do not use towel bars as grab bars. Use non-skid mats or decals in the tub or shower. If you need to sit down in the shower, use a plastic, non-slip stool. Keep the floor dry. Clean up any water that spills on the floor  as soon as it happens. Remove soap buildup in the tub or shower regularly. Attach bath mats securely with double-sided non-slip rug tape. Do not have throw rugs and other things on the floor that can make you trip. What can I do in the bedroom? Use night lights. Make sure that you have a light by your bed that is easy to reach. Do not use any sheets or blankets that are too big for your bed. They should not hang down onto the floor. Have a firm chair that has side arms. You can use this for support while you get dressed. Do not have throw rugs and other things on the floor that can make you trip. What can I do in the kitchen? Clean up any spills right away. Avoid walking on wet floors. Keep items that you use a lot in easy-to-reach places. If you need to reach something above you, use a strong step stool that has a grab bar. Keep electrical cords out of the way. Do not use floor polish or wax that makes floors slippery. If you must use wax, use non-skid floor wax. Do not have throw rugs and other things on the floor that can make you trip. What can I do with my stairs? Do not leave any items on the stairs. Make sure that there are handrails on both sides of the stairs and use them. Fix handrails that are broken or loose. Make sure that handrails are as long as the  stairways. Check any carpeting to make sure that it is firmly attached to the stairs. Fix any carpet that is loose or worn. Avoid having throw rugs at the top or bottom of the stairs. If you do have throw rugs, attach them to the floor with carpet tape. Make sure that you have a light switch at the top of the stairs and the bottom of the stairs. If you do not have them, ask someone to add them for you. What else can I do to help prevent falls? Wear shoes that: Do not have high heels. Have rubber bottoms. Are comfortable and fit you well. Are closed at the toe. Do not wear sandals. If you use a stepladder: Make sure that it is  fully opened. Do not climb a closed stepladder. Make sure that both sides of the stepladder are locked into place. Ask someone to hold it for you, if possible. Clearly mark and make sure that you can see: Any grab bars or handrails. First and last steps. Where the edge of each step is. Use tools that help you move around (mobility aids) if they are needed. These include: Canes. Walkers. Scooters. Crutches. Turn on the lights when you go into a dark area. Replace any light bulbs as soon as they burn out. Set up your furniture so you have a clear path. Avoid moving your furniture around. If any of your floors are uneven, fix them. If there are any pets around you, be aware of where they are. Review your medicines with your doctor. Some medicines can make you feel dizzy. This can increase your chance of falling. Ask your doctor what other things that you can do to help prevent falls. This information is not intended to replace advice given to you by your health care provider. Make sure you discuss any questions you have with your health care provider. Document Released: 07/21/2009 Document Revised: 03/01/2016 Document Reviewed: 10/29/2014 Elsevier Interactive Patient Education  2017 [employer].

## 2022-04-04 NOTE — Progress Notes (Signed)
I connected with Reda Gullo today by telephone and verified that I am speaking with the correct person using two identifiers. Location patient: home Location provider: work Persons participating in the virtual visit: patient, provider.   I discussed the limitations, risks, security and privacy concerns of performing an evaluation and management service by telephone and the availability of in person appointments. I also discussed with the patient that there may be a patient responsible charge related to this service. The patient expressed understanding and verbally consented to this telephonic visit.    Interactive audio and video telecommunications were attempted between this provider and patient, however failed, due to patient having technical difficulties OR patient did not have access to video capability.  We continued and completed visit with audio only.  Some vital signs may be absent or patient reported.   Time Spent with patient on telephone encounter: 30 minutes  Subjective:   Yolanda Grant is a 81 y.o. female who presents for Medicare Annual (Subsequent) preventive examination.  Review of Systems     Cardiac Risk Factors include: advanced age (>52mn, >>34women);dyslipidemia     Objective:    There were no vitals filed for this visit. There is no height or weight on file to calculate BMI.     04/04/2022    3:19 PM 04/03/2021    4:06 PM  Advanced Directives  Does Patient Have a Medical Advance Directive? Yes Yes  Type of Advance Directive Healthcare Power of [place] Does patient want to make changes to medical advance directive? No - Patient declined No - Patient declined  Copy of [place] Chart? No - copy requested No - copy requested    Current Medications (verified) Outpatient Encounter Medications as of 04/04/2022  Medication Sig   Ascorbic Acid (VITAMIN C PLUS WILD ROSE HIPS PO) Take by mouth.   Biotin  10000 MCG TABS Take 10,000 mcg by mouth in the morning.   Calcium Carb-Cholecalciferol (CALCIUM 1000 + D PO) Take 1,000 mg by mouth in the morning.   Calcium Carbonate-Vitamin D3 600-400 MG-UNIT TABS Take by mouth. 600 mg/ 800 if vitamin D3   Cranberry 500 MG CAPS Take 500 mg by mouth every evening.   D-Mannose POWD Take by mouth.   metoprolol succinate (TOPROL-XL) 50 MG 24 hr tablet Take 1 tablet (50 mg total) by mouth daily. Take with or immediately following a meal.   Multiple Vitamin (ONE-DAILY MULTI-VITAMIN) TABS Take by mouth.   pravastatin (PRAVACHOL) 20 MG tablet TAKE 1 TABLET (20 MG TOTAL) BY MOUTH DAILY. (Patient not taking: Reported on 12/11/2021)   Probiotic Product (PROBIOTIC PO) Take by mouth.   Psyllium (METAMUCIL FIBER) 51.7 % PACK Take by mouth.   raloxifene (EVISTA) 60 MG tablet Take 1 tablet (60 mg total) by mouth daily.   Turmeric 500 MG CAPS Take by mouth.   Zinc 50 MG CAPS Take by mouth.   [DISCONTINUED] benzonatate (TESSALON) 100 MG capsule Take 1 capsule by mouth every 8 (eight) hours for cough.   [DISCONTINUED] HYDROcodone bit-homatropine (HYDROMET) 5-1.5 MG/5ML syrup Take 5 mLs by mouth every 6 (six) hours as needed for cough. (Patient not taking: Reported on 12/11/2021)   [DISCONTINUED] OMEPRAZOLE PO Take 20 mg by mouth. (Patient not taking: Reported on 12/11/2021)   No facility-administered encounter medications on file as of 04/04/2022.    Allergies (verified) Sulfa antibiotics, Augmentin [amoxicillin-pot clavulanate], and Elemental sulfur   History: Past Medical History:  Diagnosis Date  Congenital spondylolisthesis of lumbar region    Hyperlipidemia    Mitral valve prolapse    Spinal stenosis    Past Surgical History:  Procedure Laterality Date   ABDOMINAL HYSTERECTOMY     anemia, heavy bleeding   AUGMENTATION MAMMAPLASTY     BREAST BIOPSY     BREAST RECONSTRUCTION WITH PLACEMENT OF TISSUE EXPANDER AND ALLODERM     TUBAL LIGATION     Family History   Problem Relation Age of Onset   Breast cancer Mother    Social History   Socioeconomic History   Marital status: Widowed    Spouse name: Not on file   Number of children: Not on file   Years of education: Not on file   Highest education level: Not on file  Occupational History   Not on file  Tobacco Use   Smoking status: Never   Smokeless tobacco: Never  Substance and Sexual Activity   Alcohol use: Not Currently   Drug use: Never   Sexual activity: Not on file  Other Topics Concern   Not on file  Social History Narrative   Not on file   Social Determinants of Health   Financial Resource Strain: Low Risk  (04/04/2022)   Overall Financial Resource Strain (CARDIA)    Difficulty of Paying Living Expenses: Not hard at all  Food Insecurity: No Food Insecurity (04/04/2022)   Hunger Vital Sign    Worried About Running Out of Food in the Last Year: Never true    [place] in the Last Year: Never true  Transportation Needs: No Transportation Needs (04/04/2022)   PRAPARE - Hydrologist (Medical): No    Lack of Transportation (Non-Medical): No  Physical Activity: Sufficiently Active (04/04/2022)   Exercise Vital Sign    Days of Exercise per Week: 7 days    Minutes of Exercise per Session: 30 min  Stress: No Stress Concern Present (04/04/2022)   [place]    Feeling of Stress : Not at all  Social Connections: Moderately Integrated (04/04/2022)   Social Connection and Isolation Panel [NHANES]    Frequency of Communication with Friends and Family: More than three times a week    Frequency of Social Gatherings with Friends and Family: More than three times a week    Attends Religious Services: More than 4 times per year    Active Member of [employer] or Organizations: Yes    Attends Archivist Meetings: More than 4 times per year    Marital Status: Widowed    Tobacco  Counseling Counseling given: Not Answered   Clinical Intake:  Pre-visit preparation completed: Yes  Pain : No/denies pain     BMI - recorded: 27.5 Nutritional Status: BMI 25 -29 Overweight Nutritional Risks: None Diabetes: No  How often do you need to have someone help you when you read instructions, pamphlets, or other written materials from your doctor or pharmacy?: 1 - Never What is the last grade level you completed in school?: HSG; some college courses  Diabetic? no  Interpreter Needed?: No  Information entered by :: Lisette Abu, LPN.   Activities of Daily Living    04/04/2022    3:26 PM  In your present state of health, do you have any difficulty performing the following activities:  Hearing? 0  Vision? 0  Difficulty concentrating or making decisions? 0  Walking or climbing stairs? 0  Dressing or bathing?  0  Doing errands, shopping? 0  Preparing Food and eating ? N  Using the Toilet? N  In the past six months, have you accidently leaked urine? N  Do you have problems with loss of bowel control? N  Managing your Medications? N  Managing your Finances? N  Housekeeping or managing your Housekeeping? N    Patient Care Team: Binnie Rail, MD as PCP - General (Internal Medicine) Minus Breeding, MD as PCP - Cardiology (Cardiology) Warden Fillers, MD as Consulting Physician (Ophthalmology)  Indicate any recent Medical Services you may have received from other than [facility] providers in the past year (date may be approximate).     Assessment:   This is a routine wellness examination for [place].  Hearing/Vision screen Hearing Screening - Comments:: Patient denied any hearing difficulty.   No hearing aids.  Vision Screening - Comments:: Patient does wears readers for fine print. Cataract surgery done on both eyes 2023 Eye exam done by: Warden Fillers, MD.   Dietary issues and exercise activities discussed: Current Exercise Habits: Home  exercise routine, Type of exercise: walking, Time (Minutes): 30, Frequency (Times/Week): 5, Weekly Exercise (Minutes/Week): 150, Intensity: Moderate, Exercise limited by: cardiac condition(s)   Goals Addressed   None   Depression Screen    04/04/2022    3:15 PM 04/03/2021    4:01 PM 11/07/2020    5:01 PM  PHQ 2/9 Scores  PHQ - 2 Score 0 0 0  PHQ- 9 Score   0    Fall Risk    04/04/2022    3:20 PM 04/03/2021    4:07 PM  [place] in the past year? 0 0  Number falls in past yr: 0 0  Injury with Fall? 0 0  Risk for fall due to : No Fall Risks No Fall Risks  Follow up Falls evaluation completed Falls evaluation completed    [place]:  Any stairs in or around the home? No  If so, are there any without handrails? No  Home free of loose throw rugs in walkways, pet beds, electrical cords, etc? Yes  Adequate lighting in your home to reduce risk of falls? Yes   ASSISTIVE DEVICES UTILIZED TO PREVENT FALLS:  Life alert? Yes  (Panic button through ADT) Use of a cane, walker or w/c? No  Grab bars in the bathroom? Yes  Shower chair or bench in shower? Yes  Elevated toilet seat or a handicapped toilet? Yes   TIMED UP AND GO:  Was the test performed? No .  Length of time to ambulate 10 feet: n/a sec.   Appearance of gait: Patient not evaluated for gait during this visit.  Cognitive Function:       04/04/2022    3:27 PM  6CIT Screen  What Year? 0 points  What month? 0 points  What time? 0 points  Count back from 20 0 points  Months in reverse 0 points  Repeat phrase 0 points  Total Score 0 points    Immunizations Immunization History  Administered Date(s) Administered   Influenza, High Dose Seasonal PF 07/24/2018   Influenza-Unspecified 05/08/2020, 07/26/2021   Moderna SARS-COV2 Booster Vaccination 08/05/2020   Moderna Sars-Covid-2 Vaccination 10/30/2019, 11/27/2019   Pneumococcal Conjugate-13 02/08/2014   Pneumococcal  Polysaccharide-23 02/21/2018   Td 12/19/2006   Tdap 02/15/2015   Zoster Recombinat (Shingrix) 11/16/2020, 02/15/2021   Zoster, Live 01/13/2008    TDAP status: Up to date  Flu Vaccine  status: Up to date  Pneumococcal vaccine status: Up to date  Covid-19 vaccine status: Completed vaccines  Qualifies for Shingles Vaccine? Yes   Zostavax completed Yes   Shingrix Completed?: Yes  Screening Tests Health Maintenance  Topic Date Due   COVID-19 Vaccine (3 - Moderna risk series) 09/02/2020   DEXA SCAN  03/30/2022   INFLUENZA VACCINE  05/08/2022   TETANUS/TDAP  02/14/2025   Pneumonia Vaccine 13+ Years old  Completed   Zoster Vaccines- Shingrix  Completed   HPV VACCINES  Aged Out    Health Maintenance  Health Maintenance Due  Topic Date Due   COVID-19 Vaccine (3 - Moderna risk series) 09/02/2020   DEXA SCAN  03/30/2022    Colorectal cancer screening: No longer required.   Mammogram status: Ordered 04/04/2022. Pt provided with contact info and advised to call to schedule appt.   Bone Density status: Ordered 04/04/2022. Pt provided with contact info and advised to call to schedule appt.  Lung Cancer Screening: (Low Dose CT Chest recommended if Age 14-80 years, 30 pack-year currently smoking OR have quit w/in 15years.) does not qualify.   Lung Cancer Screening Referral: no  Additional Screening:  Hepatitis C Screening: does not qualify; Completed no  Vision Screening: Recommended annual ophthalmology exams for early detection of glaucoma and other disorders of the eye. Is the patient up to date with their annual eye exam?  Yes  Who is the provider or what is the name of the office in which the patient attends annual eye exams? Warden Fillers, MD. If pt is not established with a provider, would they like to be referred to a provider to establish care? No .   Dental Screening: Recommended annual dental exams for proper oral hygiene  Community Resource Referral / Chronic  Care Management: CRR required this visit?  No   CCM required this visit?  No      Plan:     I have personally reviewed and noted the following in the patient's chart:   Medical and social history Use of alcohol, tobacco or illicit drugs  Current medications and supplements including opioid prescriptions.  Functional ability and status Nutritional status Physical activity Advanced directives List of other physicians Hospitalizations, surgeries, and ER visits in previous 12 months Vitals Screenings to include cognitive, depression, and falls Referrals and appointments  In addition, I have reviewed and discussed with patient certain preventive protocols, quality metrics, and best practice recommendations. A written personalized care plan for preventive services as well as general preventive health recommendations were provided to patient.     Sheral Flow, LPN   [id number]/67/[id number]   Nurse Notes:  Patient is cogitatively intact. There were no vitals filed for this visit. There is no height or weight on file to calculate BMI. Patient stated that she has no issues with gait or balance; does not use any assistive devices.

## 2022-04-11 ENCOUNTER — Other Ambulatory Visit: Payer: Self-pay | Admitting: Internal Medicine

## 2022-04-11 DIAGNOSIS — Z1239 Encounter for other screening for malignant neoplasm of breast: Secondary | ICD-10-CM

## 2022-04-17 ENCOUNTER — Other Ambulatory Visit: Payer: Self-pay | Admitting: Internal Medicine

## 2022-04-17 ENCOUNTER — Other Ambulatory Visit: Payer: Self-pay | Admitting: Cardiology

## 2022-05-09 ENCOUNTER — Encounter: Payer: Self-pay | Admitting: Internal Medicine

## 2022-05-09 NOTE — Progress Notes (Unsigned)
Subjective:    Patient ID: Yolanda Grant, female    DOB: Mar 04, 1941, 81 y.o.   MRN: [id number]      HPI Kiva is here for a Physical exam.    Swelling in feet x 2-3 weeks - was doing well and not sure why she has the swelling.  Watches her salt intake.  Walking 1 mile most days.  When sitting during the day she does not elevate her legs.  She is fairly active.   Medications and allergies reviewed with patient and updated if appropriate.  Current Outpatient Medications on File Prior to Visit  Medication Sig Dispense Refill   Ascorbic Acid (VITAMIN C PLUS WILD ROSE HIPS PO) Take by mouth.     Biotin 10000 MCG TABS Take 10,000 mcg by mouth in the morning.     Calcium Carb-Cholecalciferol (CALCIUM 1000 + D PO) Take 1,000 mg by mouth in the morning.     Calcium Carbonate-Vitamin D3 600-400 MG-UNIT TABS Take by mouth. 600 mg/ 800 if vitamin D3     Cranberry 500 MG CAPS Take 500 mg by mouth every evening.     D-Mannose POWD Take by mouth.     metoprolol succinate (TOPROL-XL) 50 MG 24 hr tablet Take 1 tablet (50 mg total) by mouth daily. Take with or immediately following a meal. 90 tablet 3   Multiple Vitamin (ONE-DAILY MULTI-VITAMIN) TABS Take by mouth.     pravastatin (PRAVACHOL) 20 MG tablet Take 1 tablet (20 mg total) by mouth daily. 90 tablet 2   Probiotic Product (PROBIOTIC PO) Take by mouth.     Psyllium (METAMUCIL FIBER) 51.7 % PACK Take by mouth.     raloxifene (EVISTA) 60 MG tablet TAKE ONE TABLET BY MOUTH DAILY 90 tablet 0   Turmeric 500 MG CAPS Take by mouth.     Zinc 50 MG CAPS Take by mouth.     No current facility-administered medications on file prior to visit.    Review of Systems  Constitutional:  Negative for fever.  Eyes:  Negative for visual disturbance.  Respiratory:  Negative for cough, shortness of breath and wheezing.   Cardiovascular:  Positive for palpitations (occ) and leg swelling. Negative for chest pain.  Gastrointestinal:  Negative for  abdominal pain, blood in stool, constipation, diarrhea and nausea.  Genitourinary:  Negative for dysuria.  Musculoskeletal:  Negative for arthralgias and back pain.  Skin:  Negative for rash.  Neurological:  Negative for light-headedness and headaches.  Psychiatric/Behavioral:  Negative for dysphoric mood. The patient is not nervous/anxious.        Objective:   Vitals:   05/10/22 1025  BP: 110/72  Pulse: 60  Temp: 97.8 F (36.6 C)  SpO2: 97%   Filed Weights   05/10/22 1025  Weight: 162 lb 8 oz (73.7 kg)   Body mass index is 27.89 kg/m.  BP Readings from Last 3 Encounters:  05/10/22 110/72  12/11/21 100/72  08/08/21 116/72    Wt Readings from Last 3 Encounters:  05/10/22 162 lb 8 oz (73.7 kg)  12/11/21 160 lb 3.2 oz (72.7 kg)  08/08/21 159 lb (72.1 kg)       Physical Exam Constitutional: She appears well-developed and well-nourished. No distress.  HENT:  Head: Normocephalic and atraumatic.  Right Ear: External ear normal. Normal ear canal and TM Left Ear: External ear normal.  Normal ear canal and TM Mouth/Throat: Oropharynx is clear and moist.  Eyes: Conjunctivae normal.  Neck: Neck supple. No tracheal  deviation present. No thyromegaly present.  No carotid bruit  Cardiovascular: Normal rate, regular rhythm and normal heart sounds.   No murmur heard.  No edema. Pulmonary/Chest: Effort normal and breath sounds normal. No respiratory distress. She has no wheezes. She has no rales.  Breast: deferred   Abdominal: Soft. She exhibits no distension. There is no tenderness.  Lymphadenopathy: She has no cervical adenopathy.  Skin: Skin is warm and dry. She is not diaphoretic.  Psychiatric: She has a normal mood and affect. Her behavior is normal.     Lab Results  Component Value Date   WBC 4.9 05/08/2021   HGB 13.2 05/08/2021   HCT 40.8 05/08/2021   PLT 230.0 05/08/2021   GLUCOSE 82 05/08/2021   CHOL 198 05/08/2021   TRIG 161.0 (H) 05/08/2021   HDL 59.80  05/08/2021   LDLCALC 106 (H) 05/08/2021   ALT 17 05/08/2021   AST 21 05/08/2021   NA 141 05/08/2021   K 4.2 05/08/2021   CL 105 05/08/2021   CREATININE 0.75 05/08/2021   BUN 11 05/08/2021   CO2 28 05/08/2021   TSH 1.52 05/08/2021         Assessment & Plan:   Physical exam: Screening blood work  ordered Exercise  walking Weight  good Substance abuse  none   Reviewed recommended immunizations.   Health Maintenance  Topic Date Due   COVID-19 Vaccine (3 - Moderna risk series) 09/02/2020   DEXA SCAN  03/30/2022   INFLUENZA VACCINE  05/08/2022   TETANUS/TDAP  02/14/2025   Pneumonia Vaccine 54+ Years old  Completed   Zoster Vaccines- Shingrix  Completed   HPV VACCINES  Aged Out      Dexa scheduled    See Problem List for Assessment and Plan of chronic medical problems.

## 2022-05-09 NOTE — Patient Instructions (Addendum)
Blood work was ordered.   Medications changes include :   hydrochlorothiazide 12.5 mg daily as needed for swelling.  Try to use your compression socks.   Your prescription(s) have been sent to your pharmacy.      Return in about 1 year (around 05/11/2023) for Physical Exam.    Health Maintenance, Female Adopting a healthy lifestyle and getting preventive care are important in promoting health and wellness. Ask your health care provider about: The right schedule for you to have regular tests and exams. Things you can do on your own to prevent diseases and keep yourself healthy. What should I know about diet, weight, and exercise? Eat a healthy diet  Eat a diet that includes plenty of vegetables, fruits, low-fat dairy products, and lean protein. Do not eat a lot of foods that are high in solid fats, added sugars, or sodium. Maintain a healthy weight Body mass index (BMI) is used to identify weight problems. It estimates body fat based on height and weight. Your health care provider can help determine your BMI and help you achieve or maintain a healthy weight. Get regular exercise Get regular exercise. This is one of the most important things you can do for your health. Most adults should: Exercise for at least 150 minutes each week. The exercise should increase your heart rate and make you sweat (moderate-intensity exercise). Do strengthening exercises at least twice a week. This is in addition to the moderate-intensity exercise. Spend less time sitting. Even light physical activity can be beneficial. Watch cholesterol and blood lipids Have your blood tested for lipids and cholesterol at 81 years of age, then have this test every 5 years. Have your cholesterol levels checked more often if: Your lipid or cholesterol levels are high. You are older than 81 years of age. You are at high risk for heart disease. What should I know about cancer screening? Depending on your health  history and family history, you may need to have cancer screening at various ages. This may include screening for: Breast cancer. Cervical cancer. Colorectal cancer. Skin cancer. Lung cancer. What should I know about heart disease, diabetes, and high blood pressure? Blood pressure and heart disease High blood pressure causes heart disease and increases the risk of stroke. This is more likely to develop in people who have high blood pressure readings or are overweight. Have your blood pressure checked: Every 3-5 years if you are 11-18 years of age. Every year if you are 81 years old or older. Diabetes Have regular diabetes screenings. This checks your fasting blood sugar level. Have the screening done: Once every three years after age 95 if you are at a normal weight and have a low risk for diabetes. More often and at a younger age if you are overweight or have a high risk for diabetes. What should I know about preventing infection? Hepatitis B If you have a higher risk for hepatitis B, you should be screened for this virus. Talk with your health care provider to find out if you are at risk for hepatitis B infection. Hepatitis C Testing is recommended for: Everyone born from [birth year] through 1965. Anyone with known risk factors for hepatitis C. Sexually transmitted infections (STIs) Get screened for STIs, including gonorrhea and chlamydia, if: You are sexually active and are younger than 81 years of age. You are older than 81 years of age and your health care provider tells you that you are at risk for this type of  infection. Your sexual activity has changed since you were last screened, and you are at increased risk for chlamydia or gonorrhea. Ask your health care provider if you are at risk. Ask your health care provider about whether you are at high risk for HIV. Your health care provider may recommend a prescription medicine to help prevent HIV infection. If you choose to take medicine to  prevent HIV, you should first get tested for HIV. You should then be tested every 3 months for as long as you are taking the medicine. Pregnancy If you are about to stop having your period (premenopausal) and you may become pregnant, seek counseling before you get pregnant. Take 400 to 800 micrograms (mcg) of folic acid every day if you become pregnant. Ask for birth control (contraception) if you want to prevent pregnancy. Osteoporosis and menopause Osteoporosis is a disease in which the bones lose minerals and strength with aging. This can result in bone fractures. If you are 32 years old or older, or if you are at risk for osteoporosis and fractures, ask your health care provider if you should: Be screened for bone loss. Take a calcium or vitamin D supplement to lower your risk of fractures. Be given hormone replacement therapy (HRT) to treat symptoms of menopause. Follow these instructions at home: Alcohol use Do not drink alcohol if: Your health care provider tells you not to drink. You are pregnant, may be pregnant, or are planning to become pregnant. If you drink alcohol: Limit how much you have to: 0-1 drink a day. Know how much alcohol is in your drink. In the [place] one drink equals one 12 oz bottle of beer (355 mL), one 5 oz glass of wine (148 mL), or one 1 oz glass of hard liquor (44 mL). Lifestyle Do not use any products that contain nicotine or tobacco. These products include cigarettes, chewing tobacco, and vaping devices, such as e-cigarettes. If you need help quitting, ask your health care provider. Do not use street drugs. Do not share needles. Ask your health care provider for help if you need support or information about quitting drugs. General instructions Schedule regular health, dental, and eye exams. Stay current with your vaccines. Tell your health care provider if: You often feel depressed. You have ever been abused or do not feel safe at  home. Summary Adopting a healthy lifestyle and getting preventive care are important in promoting health and wellness. Follow your health care provider's instructions about healthy diet, exercising, and getting tested or screened for diseases. Follow your health care provider's instructions on monitoring your cholesterol and blood pressure. This information is not intended to replace advice given to you by your health care provider. Make sure you discuss any questions you have with your health care provider. Document Revised: 02/13/2021 Document Reviewed: 02/13/2021 Elsevier Patient Education  [place].

## 2022-05-10 ENCOUNTER — Ambulatory Visit (INDEPENDENT_AMBULATORY_CARE_PROVIDER_SITE_OTHER): Payer: PPO | Admitting: Internal Medicine

## 2022-05-10 VITALS — BP 110/72 | HR 60 | Temp 97.8°F | Ht 64.0 in | Wt 162.5 lb

## 2022-05-10 DIAGNOSIS — E782 Mixed hyperlipidemia: Secondary | ICD-10-CM

## 2022-05-10 DIAGNOSIS — M810 Age-related osteoporosis without current pathological fracture: Secondary | ICD-10-CM | POA: Diagnosis not present

## 2022-05-10 DIAGNOSIS — Z0000 Encounter for general adult medical examination without abnormal findings: Secondary | ICD-10-CM | POA: Diagnosis not present

## 2022-05-10 DIAGNOSIS — I471 Supraventricular tachycardia: Secondary | ICD-10-CM

## 2022-05-10 DIAGNOSIS — R600 Localized edema: Secondary | ICD-10-CM

## 2022-05-10 DIAGNOSIS — K219 Gastro-esophageal reflux disease without esophagitis: Secondary | ICD-10-CM

## 2022-05-10 LAB — CBC WITH DIFFERENTIAL/PLATELET
Basophils Absolute: 0 10*3/uL (ref 0.0–0.1)
Basophils Relative: 0.1 % (ref 0.0–3.0)
Eosinophils Absolute: 0 10*3/uL (ref 0.0–0.7)
Eosinophils Relative: 0.8 % (ref 0.0–5.0)
HCT: 41.1 % (ref 36.0–46.0)
Hemoglobin: 13.6 g/dL (ref 12.0–15.0)
Lymphocytes Relative: 35.2 % (ref 12.0–46.0)
Lymphs Abs: 1.6 10*3/uL (ref 0.7–4.0)
MCHC: 33.1 g/dL (ref 30.0–36.0)
MCV: 93.7 fl (ref 78.0–100.0)
Monocytes Absolute: 0.4 10*3/uL (ref 0.1–1.0)
Monocytes Relative: 8.5 % (ref 3.0–12.0)
Neutro Abs: 2.5 10*3/uL (ref 1.4–7.7)
Neutrophils Relative %: 55.4 % (ref 43.0–77.0)
Platelets: 215 10*3/uL (ref 150.0–400.0)
RBC: 4.38 Mil/uL (ref 3.87–5.11)
RDW: 14.9 % (ref 11.5–15.5)
WBC: 4.5 10*3/uL (ref 4.0–10.5)

## 2022-05-10 LAB — COMPREHENSIVE METABOLIC PANEL
ALT: 17 U/L (ref 0–35)
AST: 23 U/L (ref 0–37)
Albumin: 4.1 g/dL (ref 3.5–5.2)
Alkaline Phosphatase: 79 U/L (ref 39–117)
BUN: 18 mg/dL (ref 6–23)
CO2: 31 mEq/L (ref 19–32)
Calcium: 9.4 mg/dL (ref 8.4–10.5)
Chloride: 104 mEq/L (ref 96–112)
Creatinine, Ser: 0.79 mg/dL (ref 0.40–1.20)
GFR: 70.11 mL/min (ref >60.00)
Glucose, Bld: 93 mg/dL (ref 70–99)
Potassium: 3.9 mEq/L (ref 3.5–5.1)
Sodium: 140 mEq/L (ref 135–145)
Total Bilirubin: 0.7 mg/dL (ref 0.2–1.2)
Total Protein: 7 g/dL (ref 6.0–8.3)

## 2022-05-10 LAB — LIPID PANEL
Cholesterol: 209 mg/dL — ABNORMAL HIGH (ref 0–200)
HDL: 70.7 mg/dL (ref >39.00)
LDL Cholesterol: 119 mg/dL — ABNORMAL HIGH (ref 0–99)
NonHDL: 137.84
Total CHOL/HDL Ratio: 3
Triglycerides: 94 mg/dL (ref 0.0–149.0)
VLDL: 18.8 mg/dL (ref 0.0–40.0)

## 2022-05-10 LAB — TSH: TSH: 1.22 u[IU]/mL (ref 0.35–5.50)

## 2022-05-10 LAB — VITAMIN D 25 HYDROXY (VIT D DEFICIENCY, FRACTURES): VITD: 63.06 ng/mL (ref 30.00–100.00)

## 2022-05-10 MED ORDER — FAMOTIDINE 20 MG PO TABS: 20.0000 mg | ORAL_TABLET | Freq: Every day | ORAL | Status: AC

## 2022-05-10 MED ORDER — HYDROCHLOROTHIAZIDE 12.5 MG PO TABS: 12.5000 mg | ORAL_TABLET | Freq: Every day | ORAL | 3 refills | Status: DC | PRN

## 2022-05-10 NOTE — Assessment & Plan Note (Signed)
Chronic Regular exercise and healthy diet encouraged Check lipid panel  Continue pravastatin 20 mg daily 

## 2022-05-10 NOTE — Assessment & Plan Note (Addendum)
Chronic DEXA due-scheduled Has been on Evista for a while-concerned that this is not protecting her hips Discussed switching to a different bisphosphonate Encouraged regular exercise Continue calcium and vitamin D Check CMP, CBC, vitamin D

## 2022-05-10 NOTE — Assessment & Plan Note (Signed)
New Likely related to chronic venous insufficiency No symptoms consistent with heart failure Continue low-sodium diet, continue regular walking Start elevating legs when sitting during the day She does have compression socks-consider wearing them We will try HCTZ 12.5 mg daily as needed only to see if that helps Discussed side effects-this has a potential to lower her blood pressure and cause lightheadedness Recommended monitoring BP at home She will try the medication with caution in see if helps with the swelling and monitor closely for side effects

## 2022-05-10 NOTE — Assessment & Plan Note (Signed)
Chronic GERD controlled Continue pepcid 20 mg daiy

## 2022-05-10 NOTE — Assessment & Plan Note (Signed)
Chronic Following with cardiology-Dr. Curt Bears Controlled Continue metoprolol XL 50 mg daily CBC, CMP, TSH

## 2022-05-31 IMAGING — CR DG LUMBAR SPINE COMPLETE 4+V
5 series · 5 of 5 positions shown · non-contrast
Comparison: None.

CLINICAL DATA: Lumbar facet joint pain.  Back pain for 10 years.

EXAM:
LUMBAR SPINE - COMPLETE 4+ VIEW

[t lumbar spine ap]
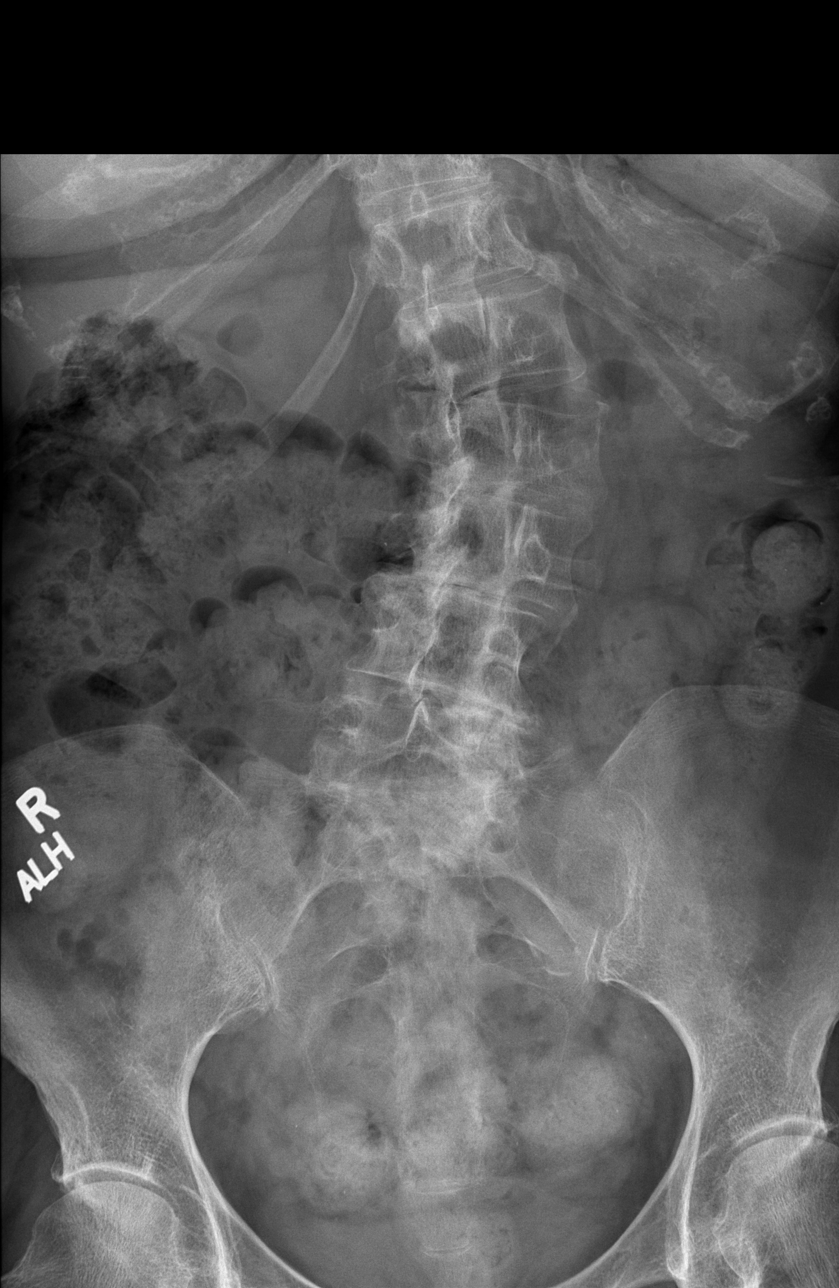

[t lumbar spine obl (1 of 2)]
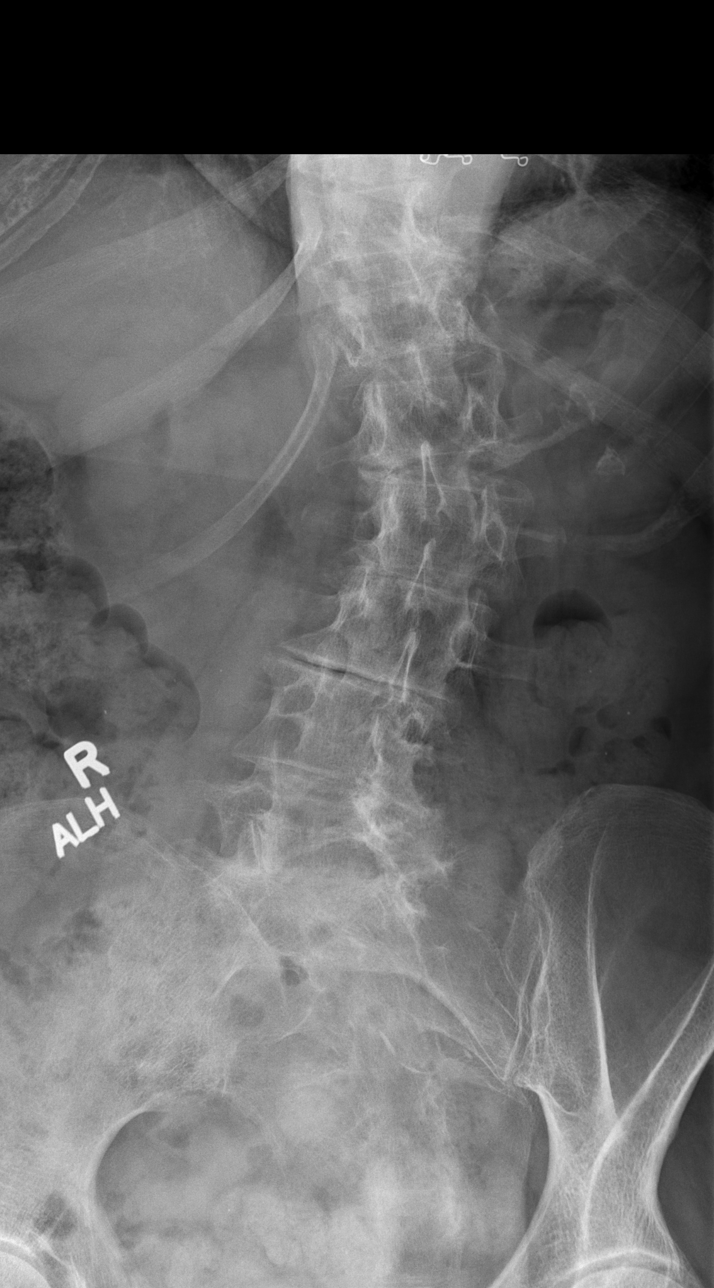

[t lumbar spine obl (2 of 2)]
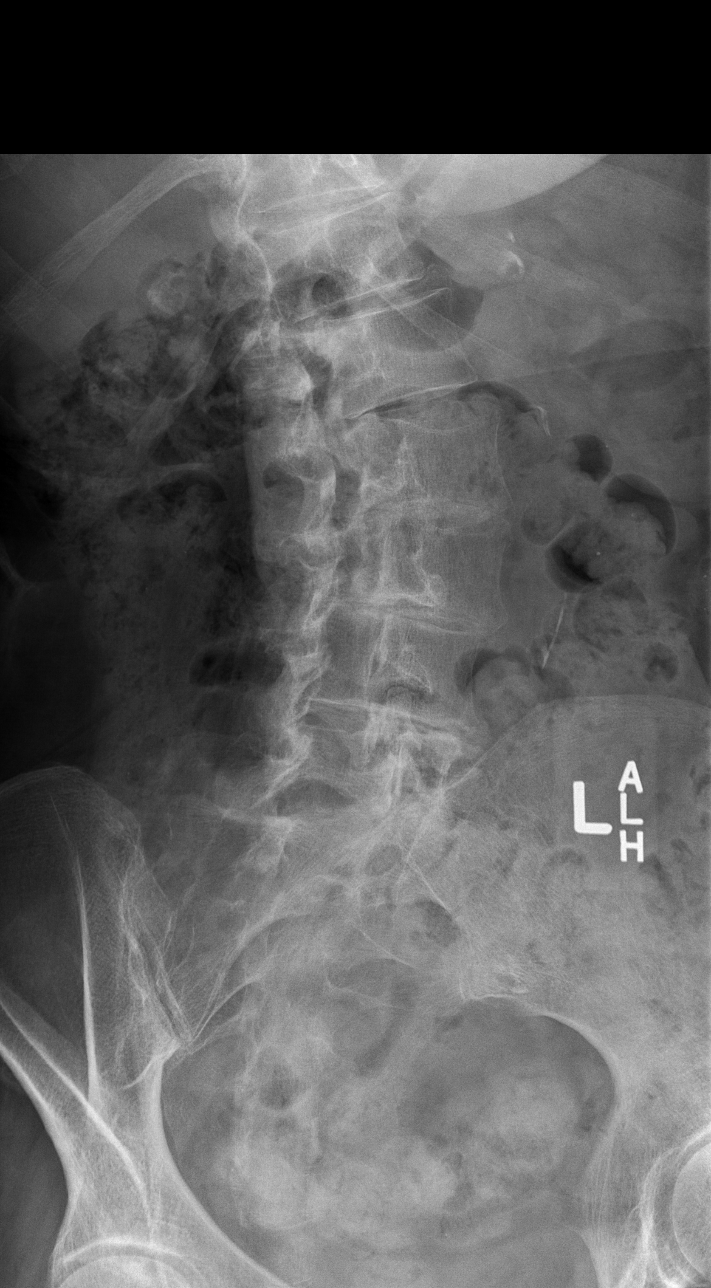

[t lumbar spine lat]
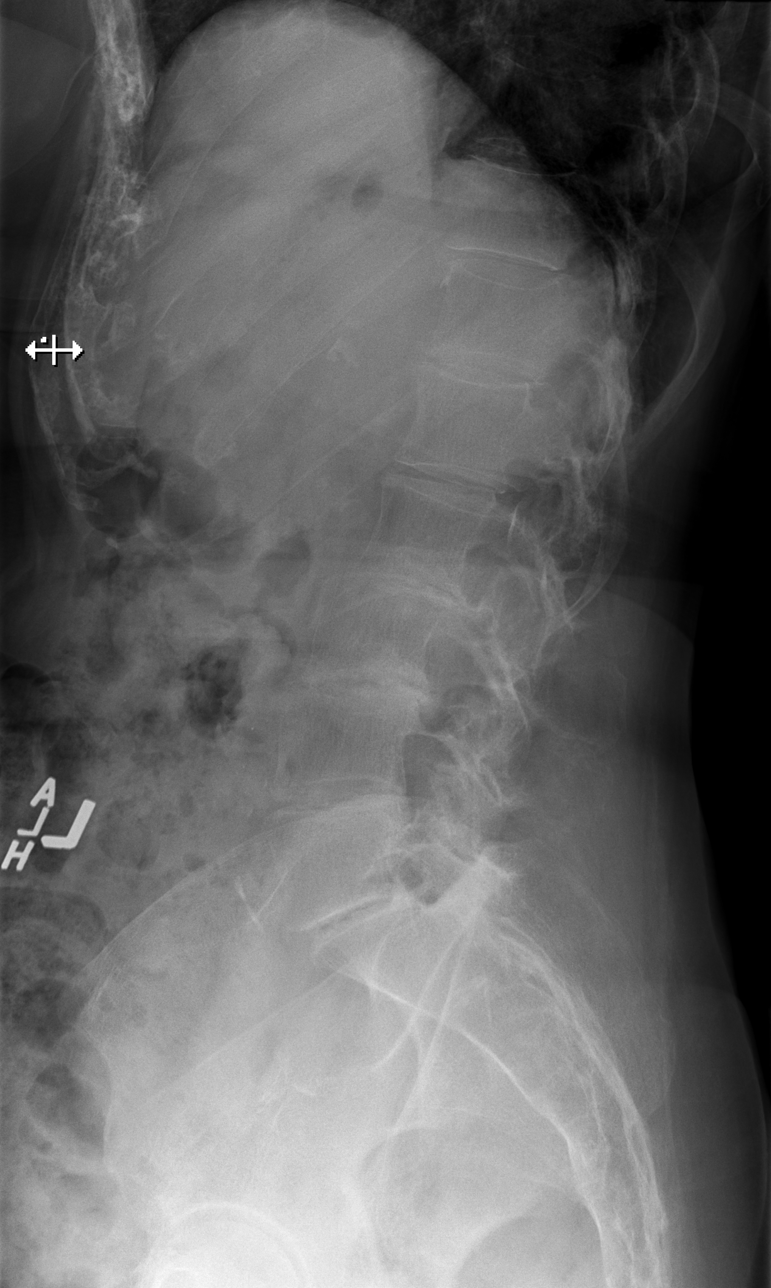

[t lumbar l-5 s-1 spot]
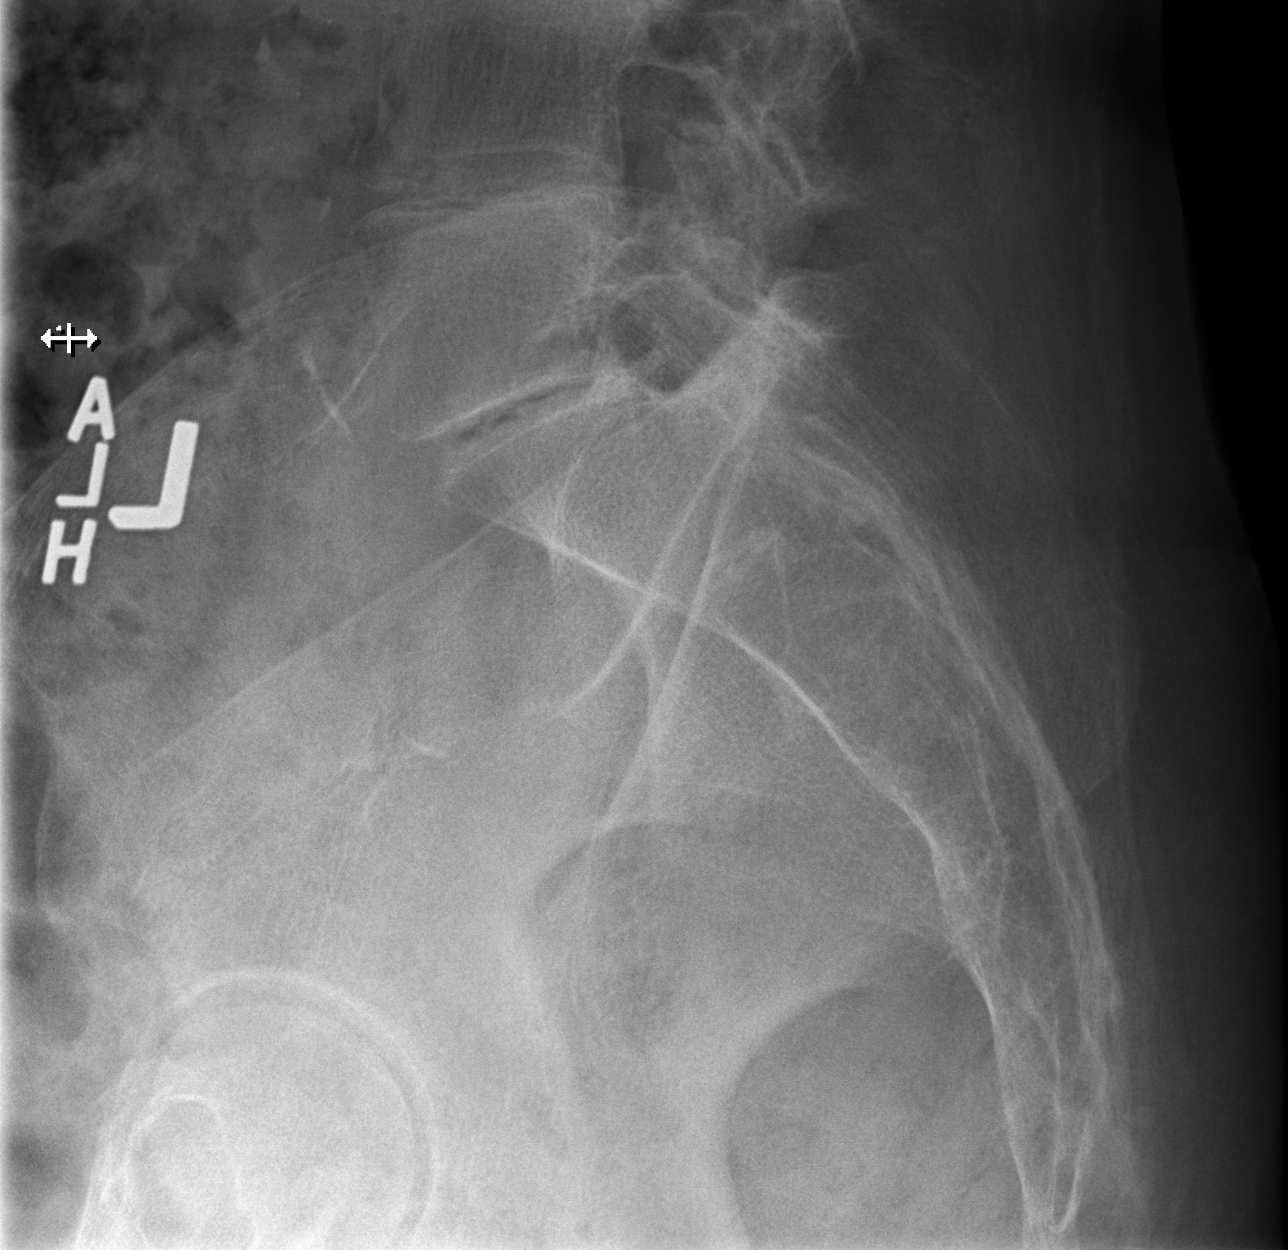

[5 of 5 positions shown; findings below may reference images not displayed]

FINDINGS: Curvature of the lumbar spine, apex to the left. No other
malalignment. No fractures. Lower lumbar facet degenerative changes
degenerative disc disease most prominent at L2-3 and L3-4. No other
abnormalities.
IMPRESSION: Degenerative changes. Scoliotic curvature of the lumbar spine, apex
to the left.

## 2022-06-05 ENCOUNTER — Ambulatory Visit
Admission: RE | Admit: 2022-06-05 | Discharge: 2022-06-05 | Disposition: A | Discharge status: Home / Self Care | Payer: PPO | Source: Ambulatory Visit | Attending: Internal Medicine | Admitting: Internal Medicine

## 2022-06-05 DIAGNOSIS — M85852 Other specified disorders of bone density and structure, left thigh: Secondary | ICD-10-CM | POA: Diagnosis not present

## 2022-06-05 DIAGNOSIS — Z780 Asymptomatic menopausal state: Secondary | ICD-10-CM | POA: Diagnosis not present

## 2022-06-05 DIAGNOSIS — Z13820 Encounter for screening for osteoporosis: Secondary | ICD-10-CM

## 2022-06-05 DIAGNOSIS — M810 Age-related osteoporosis without current pathological fracture: Secondary | ICD-10-CM | POA: Diagnosis not present

## 2022-06-05 DIAGNOSIS — Z1239 Encounter for other screening for malignant neoplasm of breast: Secondary | ICD-10-CM

## 2022-06-05 DIAGNOSIS — Z1231 Encounter for screening mammogram for malignant neoplasm of breast: Secondary | ICD-10-CM | POA: Diagnosis not present

## 2022-06-06 LAB — HM DEXA SCAN

## 2022-06-13 ENCOUNTER — Encounter: Payer: Self-pay | Admitting: Internal Medicine

## 2022-06-13 NOTE — Progress Notes (Signed)
Outside notes received. Information abstracted. Notes sent to scan.  

## 2022-07-20 ENCOUNTER — Other Ambulatory Visit: Payer: Self-pay | Admitting: Internal Medicine

## 2022-07-28 DIAGNOSIS — R051 Acute cough: Secondary | ICD-10-CM | POA: Diagnosis not present

## 2022-07-28 DIAGNOSIS — J069 Acute upper respiratory infection, unspecified: Secondary | ICD-10-CM | POA: Diagnosis not present

## 2022-07-28 DIAGNOSIS — J029 Acute pharyngitis, unspecified: Secondary | ICD-10-CM | POA: Diagnosis not present

## 2022-08-13 NOTE — Progress Notes (Unsigned)
    Subjective:    Patient ID: Yolanda Grant, female    DOB: 15-Jun-1941, 81 y.o.   MRN: [id number]      HPI Kerstin is here for No chief complaint on file.   She is here for an acute visit for cold symptoms.   Her symptoms started   She is experiencing   She has tried taking       Medications and allergies reviewed with patient and updated if appropriate.  Current Outpatient Medications on File Prior to Visit  Medication Sig Dispense Refill   Ascorbic Acid (VITAMIN C PLUS WILD ROSE HIPS PO) Take by mouth.     Biotin 10000 MCG TABS Take 10,000 mcg by mouth in the morning.     Calcium Carb-Cholecalciferol (CALCIUM 1000 + D PO) Take 1,000 mg by mouth in the morning.     Calcium Carbonate-Vitamin D3 600-400 MG-UNIT TABS Take by mouth. 600 mg/ 800 if vitamin D3     Cranberry 500 MG CAPS Take 500 mg by mouth every evening.     D-Mannose POWD Take by mouth.     famotidine (PEPCID) 20 MG tablet Take 1 tablet (20 mg total) by mouth daily.     hydrochlorothiazide (HYDRODIURIL) 12.5 MG tablet Take 1 tablet (12.5 mg total) by mouth daily as needed. 30 tablet 3   metoprolol succinate (TOPROL-XL) 50 MG 24 hr tablet Take 1 tablet (50 mg total) by mouth daily. Take with or immediately following a meal. 90 tablet 3   Multiple Vitamin (ONE-DAILY MULTI-VITAMIN) TABS Take by mouth.     pravastatin (PRAVACHOL) 20 MG tablet Take 1 tablet (20 mg total) by mouth daily. 90 tablet 2   Probiotic Product (PROBIOTIC PO) Take by mouth.     Psyllium (METAMUCIL FIBER) 51.7 % PACK Take by mouth.     raloxifene (EVISTA) 60 MG tablet TAKE ONE TABLET BY MOUTH DAILY 90 tablet 0   Turmeric 500 MG CAPS Take by mouth.     Zinc 50 MG CAPS Take by mouth.     No current facility-administered medications on file prior to visit.    Review of Systems     Objective:  There were no vitals filed for this visit. BP Readings from Last 3 Encounters:  05/10/22 110/72  12/11/21 100/72  08/08/21 116/72   Wt  Readings from Last 3 Encounters:  05/10/22 162 lb 8 oz (73.7 kg)  12/11/21 160 lb 3.2 oz (72.7 kg)  08/08/21 159 lb (72.1 kg)   There is no height or weight on file to calculate BMI.    Physical Exam         Assessment & Plan:    See Problem List for Assessment and Plan of chronic medical problems.

## 2022-08-14 ENCOUNTER — Encounter: Payer: Self-pay | Admitting: Internal Medicine

## 2022-08-14 ENCOUNTER — Ambulatory Visit (INDEPENDENT_AMBULATORY_CARE_PROVIDER_SITE_OTHER): Payer: PPO | Admitting: Internal Medicine

## 2022-08-14 VITALS — BP 122/72 | HR 85 | Temp 98.7°F | Ht 64.0 in | Wt 159.0 lb

## 2022-08-14 DIAGNOSIS — J40 Bronchitis, not specified as acute or chronic: Secondary | ICD-10-CM | POA: Insufficient documentation

## 2022-08-14 MED ORDER — HYDROCODONE BIT-HOMATROP MBR 5-1.5 MG/5ML PO SOLN: 5.0000 mL | Freq: Three times a day (TID) | ORAL | 0 refills | Status: DC | PRN

## 2022-08-14 MED ORDER — DOXYCYCLINE HYCLATE 100 MG PO TABS: 100.0000 mg | ORAL_TABLET | Freq: Two times a day (BID) | ORAL | 0 refills | Status: AC

## 2022-08-14 NOTE — Patient Instructions (Addendum)
    Medications changes include :   doxycycline and cough syrups     Return if symptoms worsen or fail to improve.

## 2022-08-14 NOTE — Assessment & Plan Note (Signed)
Acute Symptoms ongoing for 3 weeks COVID-negative, flu negative, strep negative Symptoms consistent with bronchitis, possible early pneumonia Start doxycycline 100 mg twice daily x10 days Hycodan cough syrup prescribed Continue over-the-counter cold medications, increase fluids and rest Call if no improvement or worsening of symptoms

## 2022-09-17 DIAGNOSIS — H16223 Keratoconjunctivitis sicca, not specified as Sjogren's, bilateral: Secondary | ICD-10-CM | POA: Diagnosis not present

## 2022-09-17 DIAGNOSIS — H31092 Other chorioretinal scars, left eye: Secondary | ICD-10-CM | POA: Diagnosis not present

## 2022-09-17 DIAGNOSIS — H43812 Vitreous degeneration, left eye: Secondary | ICD-10-CM | POA: Diagnosis not present

## 2022-09-17 DIAGNOSIS — Z961 Presence of intraocular lens: Secondary | ICD-10-CM | POA: Diagnosis not present

## 2022-09-25 ENCOUNTER — Other Ambulatory Visit: Payer: Self-pay | Admitting: Internal Medicine

## 2022-09-25 ENCOUNTER — Other Ambulatory Visit: Payer: Self-pay | Admitting: Cardiology

## 2022-10-11 ENCOUNTER — Telehealth: Payer: Self-pay | Admitting: Internal Medicine

## 2022-10-11 ENCOUNTER — Other Ambulatory Visit: Payer: Self-pay

## 2022-10-11 MED ORDER — RALOXIFENE HCL 60 MG PO TABS: 60.0000 mg | ORAL_TABLET | Freq: Every day | ORAL | 0 refills | Status: DC

## 2022-10-11 NOTE — Telephone Encounter (Signed)
Sent in for patient today.

## 2022-10-11 NOTE — Telephone Encounter (Signed)
Caller & Relationship to patient: self  Call back number:   Date of last office visit: 11.7.23  Date of next office visit: 8.5.24  Medication(s) to be refilled:  raloxifene (EVISTA) 60 MG tablet    Preferred Pharmacy:   [id number]  Phone: [phone] Fax: [phone]   Pt has requested we use new pharmacy

## 2022-11-01 DIAGNOSIS — Z96649 Presence of unspecified artificial hip joint: Secondary | ICD-10-CM | POA: Diagnosis not present

## 2022-11-01 DIAGNOSIS — K5900 Constipation, unspecified: Secondary | ICD-10-CM | POA: Diagnosis not present

## 2022-11-01 DIAGNOSIS — Z79810 Long term (current) use of selective estrogen receptor modulators (SERMs): Secondary | ICD-10-CM | POA: Diagnosis not present

## 2022-11-01 DIAGNOSIS — E785 Hyperlipidemia, unspecified: Secondary | ICD-10-CM | POA: Diagnosis not present

## 2022-11-01 DIAGNOSIS — M810 Age-related osteoporosis without current pathological fracture: Secondary | ICD-10-CM | POA: Diagnosis not present

## 2022-11-01 DIAGNOSIS — Z882 Allergy status to sulfonamides status: Secondary | ICD-10-CM | POA: Diagnosis not present

## 2022-11-01 DIAGNOSIS — Z8249 Family history of ischemic heart disease and other diseases of the circulatory system: Secondary | ICD-10-CM | POA: Diagnosis not present

## 2022-11-01 DIAGNOSIS — Z803 Family history of malignant neoplasm of breast: Secondary | ICD-10-CM | POA: Diagnosis not present

## 2022-11-01 DIAGNOSIS — Z008 Encounter for other general examination: Secondary | ICD-10-CM | POA: Diagnosis not present

## 2022-11-01 DIAGNOSIS — I499 Cardiac arrhythmia, unspecified: Secondary | ICD-10-CM | POA: Diagnosis not present

## 2022-11-01 DIAGNOSIS — K219 Gastro-esophageal reflux disease without esophagitis: Secondary | ICD-10-CM | POA: Diagnosis not present

## 2022-11-30 ENCOUNTER — Ambulatory Visit (HOSPITAL_COMMUNITY): Payer: Medicare HMO | Attending: Internal Medicine

## 2022-11-30 DIAGNOSIS — I359 Nonrheumatic aortic valve disorder, unspecified: Secondary | ICD-10-CM | POA: Diagnosis not present

## 2022-11-30 LAB — ECHOCARDIOGRAM COMPLETE
Area-P 1/2: 4.17 cm2
P 1/2 time: 486 msec
S' Lateral: 2.4 cm

## 2022-12-14 ENCOUNTER — Ambulatory Visit (INDEPENDENT_AMBULATORY_CARE_PROVIDER_SITE_OTHER): Payer: Medicare HMO | Admitting: Nurse Practitioner

## 2022-12-14 VITALS — BP 134/82 | HR 73 | Temp 98.1°F | Ht 64.0 in | Wt 163.2 lb

## 2022-12-14 DIAGNOSIS — J019 Acute sinusitis, unspecified: Secondary | ICD-10-CM | POA: Insufficient documentation

## 2022-12-14 MED ORDER — HYDROCODONE BIT-HOMATROP MBR 5-1.5 MG/5ML PO SOLN: 5.0000 mL | Freq: Two times a day (BID) | ORAL | 0 refills | Status: DC | PRN

## 2022-12-14 MED ORDER — BENZONATATE 100 MG PO CAPS: 100.0000 mg | ORAL_CAPSULE | Freq: Two times a day (BID) | ORAL | 0 refills | Status: DC | PRN

## 2022-12-14 MED ORDER — DOXYCYCLINE HYCLATE 100 MG PO TABS: 100.0000 mg | ORAL_TABLET | Freq: Two times a day (BID) | ORAL | 0 refills | Status: DC

## 2022-12-14 NOTE — Progress Notes (Signed)
Established Patient Office Visit  Subjective   Patient ID: Yolanda Grant, female    DOB: May 31, 1941  Age: 82 y.o. MRN: [id number]  Chief Complaint  Patient presents with   Cough    Going for couple weeks  Runny nose  Congestion in the upper chest area     Symptom onset 2 weeks.  Experiencing nasal congestion, postnasal drip which is triggering a cough.  Not experiencing shortness of breath or chest pain.  Has been using Flonase, Claritin-D, Alka-Seltzer, Tylenol as needed without much improvement in her symptoms.  Reports she has had similar episodes 3 times over the last 4 months.  Per chart review I see in November she had similar complaints and was treated with a course of doxycycline, Hycodan cough syrup and tolerated both well.  Has not tested for COVID at home.    Review of Systems  Constitutional:  Negative for chills and fever.  HENT:  Positive for congestion. Negative for hearing loss (had ringing in (L) ear but this is better today).        (+) PND (+) runny nose  Respiratory:  Positive for cough. Negative for shortness of breath and wheezing.   Cardiovascular:  Negative for chest pain.  Neurological:  Negative for dizziness.      Objective:     BP 134/82   Pulse 73   Temp 98.1 F (36.7 C) (Temporal)   Ht '5\' 4"'$  (1.626 m)   Wt 163 lb 4 oz (74 kg)   SpO2 95%   BMI 28.02 kg/m    Physical Exam Vitals reviewed.  Constitutional:      General: She is not in acute distress.    Appearance: Normal appearance.  HENT:     Head: Normocephalic and atraumatic.  Neck:     Vascular: No carotid bruit.  Cardiovascular:     Rate and Rhythm: Normal rate and regular rhythm.     Pulses: Normal pulses.     Heart sounds: Normal heart sounds.  Pulmonary:     Effort: Pulmonary effort is normal.     Breath sounds: Normal breath sounds.  Skin:    General: Skin is warm and dry.  Neurological:     General: No focal deficit present.     Mental Status: She is alert and  oriented to person, place, and time.  Psychiatric:        Mood and Affect: Mood normal.        Behavior: Behavior normal.        Judgment: Judgment normal.      No results found for any visits on 12/14/22.    The ASCVD Risk score (Arnett DK, et al., 2019) failed to calculate for the following reasons:   The 2019 ASCVD risk score is only valid for ages 51 to 30    Assessment & Plan:   Problem List Items Addressed This Visit       Respiratory   Acute non-recurrent sinusitis - Primary    Acute, prolonged symptoms for 2 weeks.  Will treat with course of doxycycline, Hycodan cough syrup, and Tessalon Perles as needed.  Patient warned about potential side effects of medications as well as to avoid driving or operating heavy machinery when taking Hycodan cough syrup.  Patient reports understanding.  Patient courage to follow-up before next appointment if symptoms persist or worsen.  Offered potential referral to ENT or pulmonology considering these have been recurring symptoms over the last few months, but patient declined offer  for now.      Relevant Medications   doxycycline (VIBRA-TABS) 100 MG tablet   HYDROcodone bit-homatropine (HYCODAN) 5-1.5 MG/5ML syrup   benzonatate (TESSALON) 100 MG capsule    Return if symptoms worsen or fail to improve.    Ailene Ards, NP

## 2022-12-14 NOTE — Assessment & Plan Note (Signed)
Acute, prolonged symptoms for 2 weeks.  Will treat with course of doxycycline, Hycodan cough syrup, and Tessalon Perles as needed.  Patient warned about potential side effects of medications as well as to avoid driving or operating heavy machinery when taking Hycodan cough syrup.  Patient reports understanding.  Patient courage to follow-up before next appointment if symptoms persist or worsen.  Offered potential referral to ENT or pulmonology considering these have been recurring symptoms over the last few months, but patient declined offer for now.

## 2022-12-31 ENCOUNTER — Other Ambulatory Visit: Payer: Self-pay

## 2022-12-31 ENCOUNTER — Telehealth: Payer: Self-pay | Admitting: Cardiology

## 2022-12-31 MED ORDER — METOPROLOL SUCCINATE ER 50 MG PO TB24: 50.0000 mg | ORAL_TABLET | Freq: Every day | ORAL | 0 refills | Status: DC

## 2022-12-31 NOTE — Telephone Encounter (Signed)
*  STAT* If patient is at the pharmacy, call can be transferred to refill team.   1. Which medications need to be refilled? (please list name of each medication and dose if known)  metoprolol succinate (TOPROL-XL) 50 MG 24 hr tablet  2. Which pharmacy/location (including street and city if local pharmacy) is medication to be sent to? [id number] [place], [place] [address]  3. Do they need a 30 day or 90 day supply?  90 day supply

## 2023-01-02 ENCOUNTER — Other Ambulatory Visit: Payer: Self-pay | Admitting: Internal Medicine

## 2023-01-02 MED ORDER — RALOXIFENE HCL 60 MG PO TABS: 60.0000 mg | ORAL_TABLET | Freq: Every day | ORAL | 1 refills | Status: DC

## 2023-01-15 DIAGNOSIS — B078 Other viral warts: Secondary | ICD-10-CM | POA: Diagnosis not present

## 2023-01-15 DIAGNOSIS — Z1283 Encounter for screening for malignant neoplasm of skin: Secondary | ICD-10-CM | POA: Diagnosis not present

## 2023-01-15 DIAGNOSIS — D225 Melanocytic nevi of trunk: Secondary | ICD-10-CM | POA: Diagnosis not present

## 2023-01-21 ENCOUNTER — Other Ambulatory Visit: Payer: Self-pay | Admitting: Cardiology

## 2023-02-05 ENCOUNTER — Telehealth: Payer: Self-pay | Admitting: Internal Medicine

## 2023-02-05 NOTE — Telephone Encounter (Signed)
Contacted Yolanda Grant to schedule their annual wellness visit. Appointment made for 11/04/2023.  [facility] Care Guide [facility] AWV TEAM Direct Dial: [phone]

## 2023-02-05 NOTE — Telephone Encounter (Signed)
Contacted Yolanda Grant to schedule their annual wellness visit. Patient declined to schedule AWV at this time. Done with insurance company.  [facility] Care Guide [facility] AWV TEAM Direct Dial: [phone]

## 2023-02-17 DIAGNOSIS — I471 Supraventricular tachycardia, unspecified: Secondary | ICD-10-CM | POA: Insufficient documentation

## 2023-02-17 NOTE — Progress Notes (Signed)
  Cardiology Office Note:   Date:  02/20/2023  ID:  Yolanda Grant, DOB 04-05-1941, MRN [id number]  History of Present Illness:   Yolanda Grant is a 82 y.o. female who presents for evaluation of mitral valve prolapse and mitral regurgitation that was diagnosed in [place] and was reported to be mild with mild MR.  .  Echo in Feb 2024 demonstrated trivial MR and no prolapse.  She does have mild to moderate aortic insufficiency.  She presents for follow up.  She had SVT on a monitor.  This is short-lived.  She had a couple of episodes that her watch has alerted her to but she has not really noticed it.  Again these have been short-lived with heart rates in the 130s to 140s.  If she feels anything she might cough and it will go away.  She has been exercising.  She denies any cardiovascular symptoms and has had no palpitations, presyncope or syncope.  She has not had any chest discomfort.   ROS: As stated in the HPI and negative for all other systems.  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   Risk Assessment/Calculations:              Physical Exam:   VS:  BP 120/64 (BP Location: Left Arm, Patient Position: Sitting, Cuff Size: Normal)   Pulse (!) 56   Ht 5\' 4"  (1.626 m)   Wt 164 lb 3.2 oz (74.5 kg)   SpO2 96%   BMI 28.18 kg/m    Wt Readings from Last 3 Encounters:  02/20/23 164 lb 3.2 oz (74.5 kg)  12/14/22 163 lb 4 oz (74 kg)  08/14/22 159 lb (72.1 kg)     GEN: Well nourished, well developed in no acute distress NECK: No JVD; No carotid bruits CARDIAC: RRR, 2 out of 6 apical diastolic murmur, no systolic murmurs, rubs, gallops RESPIRATORY:  Clear to auscultation without rales, wheezing or rhonchi  ABDOMEN: Soft, non-tender, non-distended EXTREMITIES:  No edema; No deformity   ASSESSMENT AND PLAN:     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  I will follow this up with an echocardiogram.   AI: This was mild to moderate on echo in Feb. I will follow this clinically.   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  No change in therapy.   Palpitations:   She had brief runs of SVT on monitor in 2023.  He is not particularly symptomatic.  She understands vagal maneuvers.  No change in therapy.  She will continue the meds as listed.     Signed, Rollene Rotunda, MD

## 2023-02-20 ENCOUNTER — Encounter: Payer: Self-pay | Admitting: Cardiology

## 2023-02-20 ENCOUNTER — Ambulatory Visit: Payer: Medicare HMO | Attending: Cardiology | Admitting: Cardiology

## 2023-02-20 VITALS — BP 120/64 | HR 56 | Ht 64.0 in | Wt 164.2 lb

## 2023-02-20 DIAGNOSIS — R002 Palpitations: Secondary | ICD-10-CM | POA: Diagnosis not present

## 2023-02-20 DIAGNOSIS — I351 Nonrheumatic aortic (valve) insufficiency: Secondary | ICD-10-CM | POA: Diagnosis not present

## 2023-02-20 DIAGNOSIS — I471 Supraventricular tachycardia, unspecified: Secondary | ICD-10-CM

## 2023-02-20 MED ORDER — METOPROLOL SUCCINATE ER 50 MG PO TB24: 50.0000 mg | ORAL_TABLET | Freq: Every day | ORAL | 3 refills | Status: DC

## 2023-02-20 MED ORDER — PRAVASTATIN SODIUM 20 MG PO TABS: 20.0000 mg | ORAL_TABLET | Freq: Every day | ORAL | 3 refills | Status: DC

## 2023-02-20 NOTE — Patient Instructions (Signed)
Medication Instructions:  Continue same medications *If you need a refill on your cardiac medications before your next appointment, please call your pharmacy*   Lab Work: None ordered   Testing/Procedures: None ordered   Follow-Up: At [facility], you and your health needs are our priority.  As part of our continuing mission to provide you with exceptional heart care, we have created designated Provider Care Teams.  These Care Teams include your primary Cardiologist (physician) and Advanced Practice Providers (APPs -  Physician Assistants and Nurse Practitioners) who all work together to provide you with the care you need, when you need it.  We recommend signing up for the patient portal called "MyChart".  Sign up information is provided on this After Visit Summary.  MyChart is used to connect with patients for Virtual Visits (Telemedicine).  Patients are able to view lab/test results, encounter notes, upcoming appointments, etc.  Non-urgent messages can be sent to your provider as well.   To learn more about what you can do with MyChart, go to [email or web address].    Your next appointment:  1 year    Call in Feb to schedule May appointment     Provider:  Dr.Hochrein

## 2023-03-18 ENCOUNTER — Telehealth: Payer: Self-pay | Admitting: Cardiology

## 2023-03-18 NOTE — Telephone Encounter (Signed)
STAT if HR is under 50 or over 120 (normal HR is 60-100 beats per minute)  What is your heart rate? 134, 140  Do you have a log of your heart rate readings (document readings)? Yes   Do you have any other symptoms? SOB, swelling in feet and ankles;

## 2023-03-18 NOTE — Telephone Encounter (Signed)
Spoke with pt regarding her elevated heart rates and increase swelling in her ankles, feet, and fingers. Pt states that over the last couple of weeks, basically since she was here for her annual visit 02/20/23, her heart rate has going "haywire". She states that she is noticing her heart rate fluctuate to 120-140bpm when she is sitting still and her watch is waking her up at night with increased heart rates. Pt states she does not typically feel these, they are brought to her attention by her watch. Pt states that she is ok walking on a flat surface but when there is an incline she notices that she gets winded. She did mention that she has started taking her blood pressure twice daily since she has noticed the increased heart rates. 6/7 (Fri): 113/96, HR:103 @6 :45pm, 6/9 (Sun): 88/63 HR: 104 @9 :30am, 104/68 HR:78 @7 :00pm, Today, 96/83 HR: 134 @7 :30am.  Pt does feel like is adequately hydrated, she drinks water throughout the day from a large cup that she tries to fill once or twice. Pt does mention that Dr. Lawerance Bach (PCP) gave her HCTX 12.5mg  tablets to take as needed for swelling. She has taken a few doses in the last week and took a dose this morning. Pt states that swelling is worst in the evenings and gets better or goes away by morning. Pt is wearing compression stockings and trying to put her feet up when she can. Pt does have a history of SVT noted on monitor. Will forward to provider for recommendations.

## 2023-03-20 NOTE — Telephone Encounter (Signed)
Advised patient that waiting on Provider response.  Please advise.

## 2023-03-20 NOTE — Telephone Encounter (Signed)
Pt c/o BP issue: STAT if pt c/o blurred vision, one-sided weakness or slurred speech  1. What are your last 5 BP readings?   106/87  HR 93 96/71  HR 98 108/61  HR 73  2. Are you having any other symptoms (ex. Dizziness, headache, blurred vision, passed out)?   Occasional lightheadedness which does not last long  3. What is your BP issue?     Patient called again regarded next steps.  Patient noted she has been using compression socks and the swelling in her feet is much better but she is still concerned about her fluctuating BP.

## 2023-03-21 MED ORDER — METOPROLOL SUCCINATE ER 25 MG PO TB24: ORAL_TABLET | ORAL | 3 refills | Status: DC

## 2023-03-21 NOTE — Telephone Encounter (Signed)
Spoke with pt, aware of the recommendations. New script sent to the pharmacy  

## 2023-04-23 ENCOUNTER — Other Ambulatory Visit: Payer: Self-pay | Admitting: Internal Medicine

## 2023-04-23 DIAGNOSIS — Z1231 Encounter for screening mammogram for malignant neoplasm of breast: Secondary | ICD-10-CM

## 2023-05-12 ENCOUNTER — Encounter: Payer: Self-pay | Admitting: Internal Medicine

## 2023-05-12 NOTE — Progress Notes (Unsigned)
Subjective:    Patient ID: Yolanda Grant, female    DOB: 01/20/1941, 82 y.o.   MRN: [id number]      HPI Yolanda Grant is here for a Physical exam and her chronic medical problems.   Overall doing well.    HCTZ prn for swelling - has taken several times-most recently when she was in [place] and she had increased leg swelling.  Couple of months ago had an episode of some palpitations noted on her watch-contacted cardiology and her metoprolol dose was adjusted  Overall she feels really good.  Medications and allergies reviewed with patient and updated if appropriate.  Current Outpatient Medications on File Prior to Visit  Medication Sig Dispense Refill   Ascorbic Acid (VITAMIN C PLUS WILD ROSE HIPS PO) Take by mouth.     Biotin 62130 MCG TABS Take 10,000 mcg by mouth in the morning.     Cranberry 500 MG CAPS Take 500 mg by mouth every evening.     D-Mannose POWD Take by mouth.     famotidine (PEPCID) 20 MG tablet Take 1 tablet (20 mg total) by mouth daily.     hydrochlorothiazide (HYDRODIURIL) 12.5 MG tablet Take 12.5 mg by mouth as needed (for lower extremity swelling.).     metoprolol succinate (TOPROL XL) 25 MG 24 hr tablet Take 12 hours after taking the 50 mg tablet 90 tablet 3   metoprolol succinate (TOPROL-XL) 50 MG 24 hr tablet Take 1 tablet (50 mg total) by mouth daily. Take with or immediately following a meal. 90 tablet 3   Multiple Vitamin (ONE-DAILY MULTI-VITAMIN) TABS Take by mouth.     pravastatin (PRAVACHOL) 20 MG tablet Take 1 tablet (20 mg total) by mouth daily. 90 tablet 3   Probiotic Product (PROBIOTIC PO) Take by mouth.     Psyllium (METAMUCIL FIBER) 51.7 % PACK Take by mouth.     Turmeric 500 MG CAPS Take by mouth.     Vitamin D-Vitamin K (K2 PLUS D3 PO) Take 90 mcg by mouth. Vitamin D3 125 mcg included     vitamin E 180 MG (400 UNITS) capsule Take 400 Units by mouth daily.     No current facility-administered medications on file prior to visit.     Review of Systems  Constitutional:  Negative for fever.  Eyes:  Negative for visual disturbance.  Respiratory:  Negative for cough, shortness of breath and wheezing.   Cardiovascular:  Positive for leg swelling. Negative for chest pain and palpitations.  Gastrointestinal:  Negative for abdominal pain, blood in stool, constipation and diarrhea.       No gerd  Genitourinary:  Negative for dysuria.  Musculoskeletal:  Positive for back pain (with certain activities). Negative for arthralgias.  Skin:  Negative for rash.  Neurological:  Negative for light-headedness and headaches.  Psychiatric/Behavioral:  Negative for dysphoric mood. The patient is not nervous/anxious.        Objective:   Vitals:   05/13/23 1008  BP: 114/78  Pulse: 94  Temp: (!) 97.5 F (36.4 C)  SpO2: 96%   Filed Weights   05/13/23 1008  Weight: 158 lb (71.7 kg)   Body mass index is 27.12 kg/m.  BP Readings from Last 3 Encounters:  05/13/23 114/78  02/20/23 120/64  12/14/22 134/82    Wt Readings from Last 3 Encounters:  05/13/23 158 lb (71.7 kg)  02/20/23 164 lb 3.2 oz (74.5 kg)  12/14/22 163 lb 4 oz (74 kg)  Physical Exam Constitutional: She appears well-developed and well-nourished. No distress.  HENT:  Head: Normocephalic and atraumatic.  Right Ear: External ear normal. Normal ear canal and TM Left Ear: External ear normal.  Normal ear canal and TM Mouth/Throat: Oropharynx is clear and moist.  Eyes: Conjunctivae normal.  Neck: Neck supple. No tracheal deviation present. No thyromegaly present.  No carotid bruit  Cardiovascular: Normal rate, irregular rhythm and normal heart sounds.   1/6 systolic murmur heard.  No edema. Pulmonary/Chest: Effort normal and breath sounds normal. No respiratory distress. She has no wheezes. She has no rales.  Breast: deferred   Abdominal: Soft. She exhibits no distension. There is no tenderness.  Lymphadenopathy: She has no cervical adenopathy.   Skin: Skin is warm and dry. She is not diaphoretic.  Psychiatric: She has a normal mood and affect. Her behavior is normal.     Lab Results  Component Value Date   WBC 4.5 05/10/2022   HGB 13.6 05/10/2022   HCT 41.1 05/10/2022   PLT 215.0 05/10/2022   GLUCOSE 93 05/10/2022   CHOL 209 (H) 05/10/2022   TRIG 94.0 05/10/2022   HDL 70.70 05/10/2022   LDLCALC 119 (H) 05/10/2022   ALT 17 05/10/2022   AST 23 05/10/2022   NA 140 05/10/2022   K 3.9 05/10/2022   CL 104 05/10/2022   CREATININE 0.79 05/10/2022   BUN 18 05/10/2022   CO2 31 05/10/2022   TSH 1.22 05/10/2022         Assessment & Plan:   Physical exam: Screening blood work  ordered Exercise  regular - does exercise videos  Weight  is good Substance abuse  none   Reviewed recommended immunizations.   Health Maintenance  Topic Date Due   COVID-19 Vaccine (3 - Moderna risk series) 09/02/2020   Medicare Annual Wellness (AWV)  04/05/2023   INFLUENZA VACCINE  01/06/2024 (Originally 05/09/2023)   DEXA SCAN  06/06/2024   DTaP/Tdap/Td (3 - Td or Tdap) 02/14/2025   Pneumonia Vaccine 59+ Years old  Completed   Zoster Vaccines- Shingrix  Completed   HPV VACCINES  Aged Out          See Problem List for Assessment and Plan of chronic medical problems.

## 2023-05-12 NOTE — Patient Instructions (Addendum)
Blood work was ordered.   The lab is on the first floor.    Medications changes include :   none    A referral was ordered and someone will call you to schedule an appointment.     Return in about 1 year (around 05/12/2024) for Physical Exam.   Health Maintenance, Female Adopting a healthy lifestyle and getting preventive care are important in promoting health and wellness. Ask your health care provider about: The right schedule for you to have regular tests and exams. Things you can do on your own to prevent diseases and keep yourself healthy. What should I know about diet, weight, and exercise? Eat a healthy diet  Eat a diet that includes plenty of vegetables, fruits, low-fat dairy products, and lean protein. Do not eat a lot of foods that are high in solid fats, added sugars, or sodium. Maintain a healthy weight Body mass index (BMI) is used to identify weight problems. It estimates body fat based on height and weight. Your health care provider can help determine your BMI and help you achieve or maintain a healthy weight. Get regular exercise Get regular exercise. This is one of the most important things you can do for your health. Most adults should: Exercise for at least 150 minutes each week. The exercise should increase your heart rate and make you sweat (moderate-intensity exercise). Do strengthening exercises at least twice a week. This is in addition to the moderate-intensity exercise. Spend less time sitting. Even light physical activity can be beneficial. Watch cholesterol and blood lipids Have your blood tested for lipids and cholesterol at 82 years of age, then have this test every 5 years. Have your cholesterol levels checked more often if: Your lipid or cholesterol levels are high. You are older than 82 years of age. You are at high risk for heart disease. What should I know about cancer screening? Depending on your health history and family history, you  may need to have cancer screening at various ages. This may include screening for: Breast cancer. Cervical cancer. Colorectal cancer. Skin cancer. Lung cancer. What should I know about heart disease, diabetes, and high blood pressure? Blood pressure and heart disease High blood pressure causes heart disease and increases the risk of stroke. This is more likely to develop in people who have high blood pressure readings or are overweight. Have your blood pressure checked: Every 3-5 years if you are 68-44 years of age. Every year if you are 4 years old or older. Diabetes Have regular diabetes screenings. This checks your fasting blood sugar level. Have the screening done: Once every three years after age 61 if you are at a normal weight and have a low risk for diabetes. More often and at a younger age if you are overweight or have a high risk for diabetes. What should I know about preventing infection? Hepatitis B If you have a higher risk for hepatitis B, you should be screened for this virus. Talk with your health care provider to find out if you are at risk for hepatitis B infection. Hepatitis C Testing is recommended for: Everyone born from [birth year] through 1965. Anyone with known risk factors for hepatitis C. Sexually transmitted infections (STIs) Get screened for STIs, including gonorrhea and chlamydia, if: You are sexually active and are younger than 82 years of age. You are older than 82 years of age and your health care provider tells you that you are at risk for this type  of infection. Your sexual activity has changed since you were last screened, and you are at increased risk for chlamydia or gonorrhea. Ask your health care provider if you are at risk. Ask your health care provider about whether you are at high risk for HIV. Your health care provider may recommend a prescription medicine to help prevent HIV infection. If you choose to take medicine to prevent HIV, you should first  get tested for HIV. You should then be tested every 3 months for as long as you are taking the medicine. Pregnancy If you are about to stop having your period (premenopausal) and you may become pregnant, seek counseling before you get pregnant. Take 400 to 800 micrograms (mcg) of folic acid every day if you become pregnant. Ask for birth control (contraception) if you want to prevent pregnancy. Osteoporosis and menopause Osteoporosis is a disease in which the bones lose minerals and strength with aging. This can result in bone fractures. If you are 51 years old or older, or if you are at risk for osteoporosis and fractures, ask your health care provider if you should: Be screened for bone loss. Take a calcium or vitamin D supplement to lower your risk of fractures. Be given hormone replacement therapy (HRT) to treat symptoms of menopause. Follow these instructions at home: Alcohol use Do not drink alcohol if: Your health care provider tells you not to drink. You are pregnant, may be pregnant, or are planning to become pregnant. If you drink alcohol: Limit how much you have to: 0-1 drink a day. Know how much alcohol is in your drink. In the [place] one drink equals one 12 oz bottle of beer (355 mL), one 5 oz glass of wine (148 mL), or one 1 oz glass of hard liquor (44 mL). Lifestyle Do not use any products that contain nicotine or tobacco. These products include cigarettes, chewing tobacco, and vaping devices, such as e-cigarettes. If you need help quitting, ask your health care provider. Do not use street drugs. Do not share needles. Ask your health care provider for help if you need support or information about quitting drugs. General instructions Schedule regular health, dental, and eye exams. Stay current with your vaccines. Tell your health care provider if: You often feel depressed. You have ever been abused or do not feel safe at home. Summary Adopting a healthy lifestyle and  getting preventive care are important in promoting health and wellness. Follow your health care provider's instructions about healthy diet, exercising, and getting tested or screened for diseases. Follow your health care provider's instructions on monitoring your cholesterol and blood pressure. This information is not intended to replace advice given to you by your health care provider. Make sure you discuss any questions you have with your health care provider. Document Revised: 02/13/2021 Document Reviewed: 02/13/2021 Elsevier Patient Education  2024 [employer].

## 2023-05-13 ENCOUNTER — Telehealth: Payer: Self-pay

## 2023-05-13 ENCOUNTER — Ambulatory Visit (INDEPENDENT_AMBULATORY_CARE_PROVIDER_SITE_OTHER): Payer: Medicare HMO | Admitting: Internal Medicine

## 2023-05-13 ENCOUNTER — Encounter: Payer: Self-pay | Admitting: Internal Medicine

## 2023-05-13 VITALS — BP 114/78 | HR 94 | Temp 97.5°F | Ht 64.0 in | Wt 158.0 lb

## 2023-05-13 DIAGNOSIS — R600 Localized edema: Secondary | ICD-10-CM | POA: Diagnosis not present

## 2023-05-13 DIAGNOSIS — I499 Cardiac arrhythmia, unspecified: Secondary | ICD-10-CM

## 2023-05-13 DIAGNOSIS — M810 Age-related osteoporosis without current pathological fracture: Secondary | ICD-10-CM | POA: Diagnosis not present

## 2023-05-13 DIAGNOSIS — Z0000 Encounter for general adult medical examination without abnormal findings: Secondary | ICD-10-CM | POA: Diagnosis not present

## 2023-05-13 DIAGNOSIS — I4719 Other supraventricular tachycardia: Secondary | ICD-10-CM | POA: Diagnosis not present

## 2023-05-13 DIAGNOSIS — I4891 Unspecified atrial fibrillation: Secondary | ICD-10-CM

## 2023-05-13 DIAGNOSIS — K219 Gastro-esophageal reflux disease without esophagitis: Secondary | ICD-10-CM | POA: Diagnosis not present

## 2023-05-13 DIAGNOSIS — E782 Mixed hyperlipidemia: Secondary | ICD-10-CM

## 2023-05-13 LAB — CBC WITH DIFFERENTIAL/PLATELET
Basophils Absolute: 0 10*3/uL (ref 0.0–0.1)
Basophils Relative: 0.3 % (ref 0.0–3.0)
Eosinophils Absolute: 0 10*3/uL (ref 0.0–0.7)
Eosinophils Relative: 0.9 % (ref 0.0–5.0)
HCT: 44.6 % (ref 36.0–46.0)
Hemoglobin: 14.3 g/dL (ref 12.0–15.0)
Lymphocytes Relative: 37.8 % (ref 12.0–46.0)
Lymphs Abs: 1.8 10*3/uL (ref 0.7–4.0)
MCHC: 32.1 g/dL (ref 30.0–36.0)
MCV: 94 fl (ref 78.0–100.0)
Monocytes Absolute: 0.4 10*3/uL (ref 0.1–1.0)
Monocytes Relative: 8.6 % (ref 3.0–12.0)
Neutro Abs: 2.5 10*3/uL (ref 1.4–7.7)
Neutrophils Relative %: 52.4 % (ref 43.0–77.0)
Platelets: 196 10*3/uL (ref 150.0–400.0)
RBC: 4.74 Mil/uL (ref 3.87–5.11)
RDW: 14.8 % (ref 11.5–15.5)
WBC: 4.7 10*3/uL (ref 4.0–10.5)

## 2023-05-13 LAB — LIPID PANEL
Cholesterol: 180 mg/dL (ref 0–200)
HDL: 63.3 mg/dL (ref >39.00)
LDL Cholesterol: 100 mg/dL — ABNORMAL HIGH (ref 0–99)
NonHDL: 116.78
Total CHOL/HDL Ratio: 3
Triglycerides: 82 mg/dL (ref 0.0–149.0)
VLDL: 16.4 mg/dL (ref 0.0–40.0)

## 2023-05-13 LAB — COMPREHENSIVE METABOLIC PANEL
ALT: 22 U/L (ref 0–35)
AST: 29 U/L (ref 0–37)
Albumin: 4.3 g/dL (ref 3.5–5.2)
Alkaline Phosphatase: 63 U/L (ref 39–117)
BUN: 22 mg/dL (ref 6–23)
CO2: 29 mEq/L (ref 19–32)
Calcium: 9.7 mg/dL (ref 8.4–10.5)
Chloride: 103 mEq/L (ref 96–112)
Creatinine, Ser: 0.87 mg/dL (ref 0.40–1.20)
GFR: 62 mL/min (ref >60.00)
Glucose, Bld: 87 mg/dL (ref 70–99)
Potassium: 4 mEq/L (ref 3.5–5.1)
Sodium: 141 mEq/L (ref 135–145)
Total Bilirubin: 0.9 mg/dL (ref 0.2–1.2)
Total Protein: 7.1 g/dL (ref 6.0–8.3)

## 2023-05-13 LAB — VITAMIN D 25 HYDROXY (VIT D DEFICIENCY, FRACTURES): VITD: >120 ng/mL

## 2023-05-13 LAB — TSH: TSH: 1.75 u[IU]/mL (ref 0.35–5.50)

## 2023-05-13 MED ORDER — APIXABAN 5 MG PO TABS: 5.0000 mg | ORAL_TABLET | Freq: Two times a day (BID) | ORAL | 1 refills | Status: DC

## 2023-05-13 MED ORDER — RALOXIFENE HCL 60 MG PO TABS: 60.0000 mg | ORAL_TABLET | Freq: Every day | ORAL | 3 refills | Status: DC

## 2023-05-13 NOTE — Assessment & Plan Note (Signed)
New onset A-fib Has paroxysmal atrial tachycardia, but rhythm on exam seems more irregular than usual EKG today: Atrial fibrillation with RVR at 101 bpm, LAD.  Only change compared to EKG from 02/2023 is that she is now in atrial fibrillation-remainder of EKG is the same. Continue metoprolol at current dose given lower BP To follow-up with cardiology Start Eliquis 5 mg twice daily Labs today including CBC, CMP, TSH

## 2023-05-13 NOTE — H&P (View-Only) (Signed)
 Cardiology Office Note:    Date:  05/14/2023   ID:  Yolanda Grant, DOB 1941-04-02, MRN [id number]  PCP:  Pincus Sanes, MD  Cardiologist:  Rollene Rotunda, MD  Electrophysiologist:  None   Referring MD: Pincus Sanes, MD   Chief Complaint: new onset atrial fibrillation  History of Present Illness:    Yolanda Grant is a 82 y.o. female with a history of newly diagnosed atrial fibrillation on Eliquis, paroxysmal SVT noted on monitor in 01/2022, mitral valve prolapse with mitral regurgitation, hyperlipidemia, and spinal stenosis who is followed by Dr. Antoine Poche who presents today for evaluation of new onset atrial fibrillation.   Patent has a long history of palpitations. Monitor in 01/2022 showed frequent short runs of SVT with longest episode lasting 12.8 seconds. She also has a history of mitral valve prolapse with associated MR. Last Echo in 11/2022 showed LVEF of 60-65% with mild LV, normal RV, no significant mitral valve prolapse and only trivial MR, and mild to moderate AI. She was last seen by Dr. Antoine Poche in 02/2023 at which time she was doing well from a cardiac standpoint.   She was seen by her PCP yesterday (05/13/2023) for a routine physical and was found to be in new onset atrial fibrillation with RVR (rate 101 bpm). She was continued on current dose Toprol-XL given soft BP and started on Eliquis.   She presents today for follow-up. EKG today shows atrial fibrillation with ventricular rate of 104 bpm. She has chronic palpitations but nothing unusual for her. No lightheadedness, dizziness, or syncope. No chest pain or shortness of breath. No orthopnea. She has a history of lower extremity edema for which she takes PRN HCTZ; however, his has been well controlled.   She state a couple of months ago she had increased palpitations and noted her heart rate was high (as high as the 140s at times). She was started on an addition 25mg  of Toprol-XL in the evening and states that after couple of  weeks everything resolved. However, she does state that her heart rate is usually in the 60s. I suspect this is when she went into atrial fibrillation. She denies any bleeding issues in the past and no current abnormal bleeding in urine or stools.   EKGs/Labs/Other Studies Reviewed:    The following studies were reviewed:  Monitor 01/22/2022 to 02/05/2022: Predominant rhythm normal sinus Frequent runs of supraventricular tachycardia though these are brief. The longest run was 12.8 seconds. No ventricular arrhythmias.  No other sustained arrhythmias. _______________  Echocardiogram 11/30/2022: Impressions: 1. Left ventricular ejection fraction, by estimation, is 60 to 65%. The  left ventricle has normal function. The left ventricle has no regional  wall motion abnormalities. There is mild concentric left ventricular  hypertrophy. Left ventricular diastolic  parameters were normal.   2. Right ventricular systolic function is normal. The right ventricular  size is normal. There is normal pulmonary artery systolic pressure.   3. The mitral valve is normal in structure. Trivial mitral valve  regurgitation. No evidence of mitral stenosis. Significant prolapse not  seen in this study.   4. The aortic valve was not well visualized. Aortic valve regurgitation  is mild to moderate. No aortic stenosis is present.   5. The inferior vena cava is normal in size with greater than 50%  respiratory variability, suggesting right atrial pressure of 3 mmHg.   Comparison(s): Prior images reviewed side by side. Stable aortic  regurgitation with normal size, function, and strain.  EKG:  EKG ordered today.  EKG Interpretation Date/Time:  Tuesday May 14 2023 15:09:08 EDT Ventricular Rate:  104 PR Interval:    QRS Duration:  70 QT Interval:  364 QTC Calculation: 478 R Axis:   -42  Text Interpretation: Atrial fibrillation with rapid ventricular response Left axis deviation Low voltage QRS Inferior  infarct , age undetermined No acute ischemic changes Confirmed by Marjie Skiff [phone]) on 05/14/2023 3:20:23 PM    Recent Labs: 05/13/2023: ALT 22; BUN 22; Creatinine, Ser 0.87; Hemoglobin 14.3; Platelets 196.0; Potassium 4.0; Sodium 141; TSH 1.75  Recent Lipid Panel    Component Value Date/Time   CHOL 180 05/13/2023 1117   TRIG 82.0 05/13/2023 1117   HDL 63.30 05/13/2023 1117   CHOLHDL 3 05/13/2023 1117   VLDL 16.4 05/13/2023 1117   LDLCALC 100 (H) 05/13/2023 1117    Physical Exam:    Vital Signs: BP 110/62 (BP Location: Left Arm, Patient Position: Sitting, Cuff Size: Normal)   Pulse (!) 104   Ht 5' 4.5" (1.638 m)   Wt 160 lb 6.4 oz (72.8 kg)   SpO2 93%   BMI 27.11 kg/m     Wt Readings from Last 3 Encounters:  05/14/23 160 lb 6.4 oz (72.8 kg)  05/13/23 158 lb (71.7 kg)  02/20/23 164 lb 3.2 oz (74.5 kg)     General: 82 y.o. Caucasian female in no acute distress. HEENT: Normocephalic and atraumatic. Sclera clear.  Neck: Supple. No carotid bruits. No JVD. Heart: Slightly tachycardic with irregularly irregular rhythm. No murmurs, gallops, or rubs.  Lungs: No increased work of breathing. Clear to ausculation bilaterally. No wheezes, rhonchi, or rales.  Abdomen: Soft, non-distended, and non-tender to palpation.  Extremities: No lower extremity edema.    Skin: Warm and dry. Neuro: Alert and oriented x3. No focal deficits. Psych: Normal affect. Responds appropriately.   Assessment:    1. Paroxysmal atrial tachycardia   2. SVT (supraventricular tachycardia)   3. MVP (mitral valve prolapse)   4. Mitral valve insufficiency, unspecified etiology   5. Aortic valve insufficiency, etiology of cardiac valve disease unspecified   6. Hyperlipidemia, unspecified hyperlipidemia type     Plan:    New Onset Atrial Fibrillation Paroxysmal SVT Patient has a long history of palpitation with frequent but short runs of SVT noted on prior monitor in 01/2022. However, she was found to be  in new onset atrial fibrillation at PCP's office yesterday and was started on Eliquis. TSH and potassium levels were normal. Recent Echo in 11/2022 showed normal LV function.  - EKG today shows atrial fibrillation with ventricular rate of 104 bpm.  - Currently on Toprol-XL 50mg  in the morning and 25mg  in the evening. Will increase to 50mg  twice daily.  - She had CBC, CMET, and TSH checked yesterday at PCP's visit and everything was normal. Will check Magnesium level today. - CHA2DS2-VASc = 3 (age x2, female). Continue Eliquis 5mg  twice daily.  - I suspect she has been in atrial fibrillation for 1-2 months based on what she tells me. Will arrange a DCCV after 3 weeks of uninterrupted anticoagulation. Procedure scheduled for 06/12/2023 with Dr. Jacques Navy. Advised patient to let me know if HR remains above 100 prior to DCCV.  Shared Decision Making/Informed Consent{ The risks (stroke, cardiac arrhythmias rarely resulting in the need for a temporary or permanent pacemaker, skin irritation or burns and complications associated with conscious sedation including aspiration, arrhythmia, respiratory failure and death), benefits (restoration of normal sinus rhythm) and alternatives  of a direct current cardioversion were explained in detail to Ms. Gavilanes and she agrees to proceed.   Mitral Valve Prolapse  Mitral Regurgitation Patient has a history of mitral valve prolapse with associated mitral regurgitation. However, no significant prolapse was noted on recent Echo in 11/2022 and there was only trivial MR.   Mild to Moderate Aortic Insufficiency Noted on recent Echo in 11/2022.  - Can continue routine serial imaging. Continue repeat Echo in  1-2 years.  Hyperlipidemia Lipid panel on 05/13/2023: Total Cholesterol 180, Triglycerides 82, HDL63, LDL 100.  - Continue Pravastatin 20mg  daily.   Disposition: Follow up in about 6 weeks after DCCV.   Medication Adjustments/Labs and Tests Ordered: Current medicines are  reviewed at length with the patient today.  Concerns regarding medicines are outlined above.  Orders Placed This Encounter  Procedures   Magnesium   EKG 12-Lead   No orders of the defined types were placed in this encounter.   Patient Instructions  Medication Instructions:  Increase your Toprol 25 mg to 50 mg for the evening dose  *If you need a refill on your cardiac medications before your next appointment, please call your pharmacy*   Lab Work: MAGNESIUM If you have labs (blood work) drawn today and your tests are completely normal, you will receive your results only by: MyChart Message (if you have MyChart) OR A paper copy in the mail If you have any lab test that is abnormal or we need to change your treatment, we will call you to review the results.   Testing/Procedures:     Dear Clint Lipps  You are scheduled for a Cardioversion on Wednesday, September 4 with Dr. Jacques Navy.  Please arrive at the [facility] (Main Entrance A) at [facility]: [address] [place], [place] [address] at 7:45 (This time is 2 hour(s) before your procedure to ensure your preparation). Free valet parking service is available. You will check in at ADMITTING. The support person will be asked to wait in the waiting room.  It is OK to have someone drop you off and come back when you are ready to be discharged.     DIET:   Nothing to eat or drink after midnight except a sip of water with medications         :1}Continue taking your anticoagulant (blood thinner): Apixaban (Eliquis).  You will need to continue this after your procedure until you are told by your provider that it is safe to stop.    FYI:  For your safety, and to allow [place] to monitor your vital signs accurately during the surgery/procedure we request: If you have artificial nails, gel coating, SNS etc, please have those removed prior to your surgery/procedure. Not having the nail coverings /polish removed may result in  cancellation or delay of your surgery/procedure.  You must have a responsible person to drive you home and stay in the waiting area during your procedure. Failure to do so could result in cancellation.  Bring your insurance cards.  *Special Note: Every effort is made to have your procedure done on time. Occasionally there are emergencies that occur at the hospital that may cause delays. Please be patient if a delay does occur.       Follow-Up: At [facility], you and your health needs are our priority.  As part of our continuing mission to provide you with exceptional heart care, we have created designated Provider Care Teams.  These Care Teams include your primary  Cardiologist (physician) and Advanced Practice Providers (APPs -  Physician Assistants and Nurse Practitioners) who all work together to provide you with the care you need, when you need it.  We recommend signing up for the patient portal called "MyChart".  Sign up information is provided on this After Visit Summary.  MyChart is used to connect with patients for Virtual Visits (Telemedicine).  Patients are able to view lab/test results, encounter notes, upcoming appointments, etc.  Non-urgent messages can be sent to your provider as well.   To learn more about what you can do with MyChart, go to [email or web address].    Your next appointment:   1-2 week(s) AFTER THE CARDIOVERSION. PLEASE CALL THE OFFICE FOR THIS APPOINTMENT.  Provider:   Marjie Skiff, PA-C           Signed, Corrin Parker, PA-C  05/14/2023 10:14 PM    [facility]

## 2023-05-13 NOTE — Telephone Encounter (Signed)
CRITICAL VALUE STICKER  CRITICAL VALUE: High vitamin D level >120  RECEIVER (on-site recipient of call): Delorise Shiner  DATE & TIME NOTIFIED: 05/13/23 @ 4:12pm   MESSENGER (representative from lab): [email or web address]  MD NOTIFIED: Yes  TIME OF NOTIFICATION: 4:20 pm

## 2023-05-13 NOTE — Assessment & Plan Note (Signed)
Chronic Likely related to chronic venous insufficiency No symptoms consistent with heart failure Continue low-sodium diet, continue regular walking elevating legs when sitting during the day She does have compression socks-consider wearing them Continue HCTZ 12.5 mg daily as needed

## 2023-05-13 NOTE — Assessment & Plan Note (Signed)
Chronic Regular exercise and healthy diet encouraged Check lipid panel, CMP Continue pravastatin 20 mg daily 

## 2023-05-13 NOTE — Telephone Encounter (Signed)
noted 

## 2023-05-13 NOTE — Progress Notes (Unsigned)
Cardiology Office Note:    Date:  05/14/2023   ID:  Yolanda Grant, DOB 1941-04-02, MRN [id number]  PCP:  Pincus Sanes, MD  Cardiologist:  Rollene Rotunda, MD  Electrophysiologist:  None   Referring MD: Pincus Sanes, MD   Chief Complaint: new onset atrial fibrillation  History of Present Illness:    Yolanda Grant is a 82 y.o. female with a history of newly diagnosed atrial fibrillation on Eliquis, paroxysmal SVT noted on monitor in 01/2022, mitral valve prolapse with mitral regurgitation, hyperlipidemia, and spinal stenosis who is followed by Dr. Antoine Poche who presents today for evaluation of new onset atrial fibrillation.   Patent has a long history of palpitations. Monitor in 01/2022 showed frequent short runs of SVT with longest episode lasting 12.8 seconds. She also has a history of mitral valve prolapse with associated MR. Last Echo in 11/2022 showed LVEF of 60-65% with mild LV, normal RV, no significant mitral valve prolapse and only trivial MR, and mild to moderate AI. She was last seen by Dr. Antoine Poche in 02/2023 at which time she was doing well from a cardiac standpoint.   She was seen by her PCP yesterday (05/13/2023) for a routine physical and was found to be in new onset atrial fibrillation with RVR (rate 101 bpm). She was continued on current dose Toprol-XL given soft BP and started on Eliquis.   She presents today for follow-up. EKG today shows atrial fibrillation with ventricular rate of 104 bpm. She has chronic palpitations but nothing unusual for her. No lightheadedness, dizziness, or syncope. No chest pain or shortness of breath. No orthopnea. She has a history of lower extremity edema for which she takes PRN HCTZ; however, his has been well controlled.   She state a couple of months ago she had increased palpitations and noted her heart rate was high (as high as the 140s at times). She was started on an addition 25mg  of Toprol-XL in the evening and states that after couple of  weeks everything resolved. However, she does state that her heart rate is usually in the 60s. I suspect this is when she went into atrial fibrillation. She denies any bleeding issues in the past and no current abnormal bleeding in urine or stools.   EKGs/Labs/Other Studies Reviewed:    The following studies were reviewed:  Monitor 01/22/2022 to 02/05/2022: Predominant rhythm normal sinus Frequent runs of supraventricular tachycardia though these are brief. The longest run was 12.8 seconds. No ventricular arrhythmias.  No other sustained arrhythmias. _______________  Echocardiogram 11/30/2022: Impressions: 1. Left ventricular ejection fraction, by estimation, is 60 to 65%. The  left ventricle has normal function. The left ventricle has no regional  wall motion abnormalities. There is mild concentric left ventricular  hypertrophy. Left ventricular diastolic  parameters were normal.   2. Right ventricular systolic function is normal. The right ventricular  size is normal. There is normal pulmonary artery systolic pressure.   3. The mitral valve is normal in structure. Trivial mitral valve  regurgitation. No evidence of mitral stenosis. Significant prolapse not  seen in this study.   4. The aortic valve was not well visualized. Aortic valve regurgitation  is mild to moderate. No aortic stenosis is present.   5. The inferior vena cava is normal in size with greater than 50%  respiratory variability, suggesting right atrial pressure of 3 mmHg.   Comparison(s): Prior images reviewed side by side. Stable aortic  regurgitation with normal size, function, and strain.  EKG:  EKG ordered today.  EKG Interpretation Date/Time:  Tuesday May 14 2023 15:09:08 EDT Ventricular Rate:  104 PR Interval:    QRS Duration:  70 QT Interval:  364 QTC Calculation: 478 R Axis:   -42  Text Interpretation: Atrial fibrillation with rapid ventricular response Left axis deviation Low voltage QRS Inferior  infarct , age undetermined No acute ischemic changes Confirmed by Marjie Skiff [phone]) on 05/14/2023 3:20:23 PM    Recent Labs: 05/13/2023: ALT 22; BUN 22; Creatinine, Ser 0.87; Hemoglobin 14.3; Platelets 196.0; Potassium 4.0; Sodium 141; TSH 1.75  Recent Lipid Panel    Component Value Date/Time   CHOL 180 05/13/2023 1117   TRIG 82.0 05/13/2023 1117   HDL 63.30 05/13/2023 1117   CHOLHDL 3 05/13/2023 1117   VLDL 16.4 05/13/2023 1117   LDLCALC 100 (H) 05/13/2023 1117    Physical Exam:    Vital Signs: BP 110/62 (BP Location: Left Arm, Patient Position: Sitting, Cuff Size: Normal)   Pulse (!) 104   Ht 5' 4.5" (1.638 m)   Wt 160 lb 6.4 oz (72.8 kg)   SpO2 93%   BMI 27.11 kg/m     Wt Readings from Last 3 Encounters:  05/14/23 160 lb 6.4 oz (72.8 kg)  05/13/23 158 lb (71.7 kg)  02/20/23 164 lb 3.2 oz (74.5 kg)     General: 82 y.o. Caucasian female in no acute distress. HEENT: Normocephalic and atraumatic. Sclera clear.  Neck: Supple. No carotid bruits. No JVD. Heart: Slightly tachycardic with irregularly irregular rhythm. No murmurs, gallops, or rubs.  Lungs: No increased work of breathing. Clear to ausculation bilaterally. No wheezes, rhonchi, or rales.  Abdomen: Soft, non-distended, and non-tender to palpation.  Extremities: No lower extremity edema.    Skin: Warm and dry. Neuro: Alert and oriented x3. No focal deficits. Psych: Normal affect. Responds appropriately.   Assessment:    1. Paroxysmal atrial tachycardia   2. SVT (supraventricular tachycardia)   3. MVP (mitral valve prolapse)   4. Mitral valve insufficiency, unspecified etiology   5. Aortic valve insufficiency, etiology of cardiac valve disease unspecified   6. Hyperlipidemia, unspecified hyperlipidemia type     Plan:    New Onset Atrial Fibrillation Paroxysmal SVT Patient has a long history of palpitation with frequent but short runs of SVT noted on prior monitor in 01/2022. However, she was found to be  in new onset atrial fibrillation at PCP's office yesterday and was started on Eliquis. TSH and potassium levels were normal. Recent Echo in 11/2022 showed normal LV function.  - EKG today shows atrial fibrillation with ventricular rate of 104 bpm.  - Currently on Toprol-XL 50mg  in the morning and 25mg  in the evening. Will increase to 50mg  twice daily.  - She had CBC, CMET, and TSH checked yesterday at PCP's visit and everything was normal. Will check Magnesium level today. - CHA2DS2-VASc = 3 (age x2, female). Continue Eliquis 5mg  twice daily.  - I suspect she has been in atrial fibrillation for 1-2 months based on what she tells me. Will arrange a DCCV after 3 weeks of uninterrupted anticoagulation. Procedure scheduled for 06/12/2023 with Dr. Jacques Navy. Advised patient to let me know if HR remains above 100 prior to DCCV.  Shared Decision Making/Informed Consent{ The risks (stroke, cardiac arrhythmias rarely resulting in the need for a temporary or permanent pacemaker, skin irritation or burns and complications associated with conscious sedation including aspiration, arrhythmia, respiratory failure and death), benefits (restoration of normal sinus rhythm) and alternatives  of a direct current cardioversion were explained in detail to Ms. Gavilanes and she agrees to proceed.   Mitral Valve Prolapse  Mitral Regurgitation Patient has a history of mitral valve prolapse with associated mitral regurgitation. However, no significant prolapse was noted on recent Echo in 11/2022 and there was only trivial MR.   Mild to Moderate Aortic Insufficiency Noted on recent Echo in 11/2022.  - Can continue routine serial imaging. Continue repeat Echo in  1-2 years.  Hyperlipidemia Lipid panel on 05/13/2023: Total Cholesterol 180, Triglycerides 82, HDL63, LDL 100.  - Continue Pravastatin 20mg  daily.   Disposition: Follow up in about 6 weeks after DCCV.   Medication Adjustments/Labs and Tests Ordered: Current medicines are  reviewed at length with the patient today.  Concerns regarding medicines are outlined above.  Orders Placed This Encounter  Procedures   Magnesium   EKG 12-Lead   No orders of the defined types were placed in this encounter.   Patient Instructions  Medication Instructions:  Increase your Toprol 25 mg to 50 mg for the evening dose  *If you need a refill on your cardiac medications before your next appointment, please call your pharmacy*   Lab Work: MAGNESIUM If you have labs (blood work) drawn today and your tests are completely normal, you will receive your results only by: MyChart Message (if you have MyChart) OR A paper copy in the mail If you have any lab test that is abnormal or we need to change your treatment, we will call you to review the results.   Testing/Procedures:     Dear Clint Lipps  You are scheduled for a Cardioversion on Wednesday, September 4 with Dr. Jacques Navy.  Please arrive at the [facility] (Main Entrance A) at [facility]: [address] [place], [place] [address] at 7:45 (This time is 2 hour(s) before your procedure to ensure your preparation). Free valet parking service is available. You will check in at ADMITTING. The support person will be asked to wait in the waiting room.  It is OK to have someone drop you off and come back when you are ready to be discharged.     DIET:   Nothing to eat or drink after midnight except a sip of water with medications         :1}Continue taking your anticoagulant (blood thinner): Apixaban (Eliquis).  You will need to continue this after your procedure until you are told by your provider that it is safe to stop.    FYI:  For your safety, and to allow [place] to monitor your vital signs accurately during the surgery/procedure we request: If you have artificial nails, gel coating, SNS etc, please have those removed prior to your surgery/procedure. Not having the nail coverings /polish removed may result in  cancellation or delay of your surgery/procedure.  You must have a responsible person to drive you home and stay in the waiting area during your procedure. Failure to do so could result in cancellation.  Bring your insurance cards.  *Special Note: Every effort is made to have your procedure done on time. Occasionally there are emergencies that occur at the hospital that may cause delays. Please be patient if a delay does occur.       Follow-Up: At [facility], you and your health needs are our priority.  As part of our continuing mission to provide you with exceptional heart care, we have created designated Provider Care Teams.  These Care Teams include your primary  Cardiologist (physician) and Advanced Practice Providers (APPs -  Physician Assistants and Nurse Practitioners) who all work together to provide you with the care you need, when you need it.  We recommend signing up for the patient portal called "MyChart".  Sign up information is provided on this After Visit Summary.  MyChart is used to connect with patients for Virtual Visits (Telemedicine).  Patients are able to view lab/test results, encounter notes, upcoming appointments, etc.  Non-urgent messages can be sent to your provider as well.   To learn more about what you can do with MyChart, go to [email or web address].    Your next appointment:   1-2 week(s) AFTER THE CARDIOVERSION. PLEASE CALL THE OFFICE FOR THIS APPOINTMENT.  Provider:   Marjie Skiff, PA-C           Signed, Corrin Parker, PA-C  05/14/2023 10:14 PM    [facility]

## 2023-05-13 NOTE — Assessment & Plan Note (Signed)
Chronic GERD controlled Continue pepcid 20 mg daiy

## 2023-05-13 NOTE — Assessment & Plan Note (Addendum)
Chronic DEXA up-to-date-due 05/2024 Has been on Evista for a while-discussed bone holiday  - she would like to continue the medication for breast cancer reduction- mom had breast ca x 2 Encouraged regular exercise Continue calcium and vitamin D Check CMP, CBC, vitamin D

## 2023-05-13 NOTE — Assessment & Plan Note (Deleted)
Chronic Following with cardiology-Dr. Elberta Fortis Controlled Continue metoprolol XL 50 mg am and metoprolol XL 25 mg in pm  CBC, CMP, TSH

## 2023-05-14 ENCOUNTER — Ambulatory Visit: Payer: Medicare HMO | Admitting: Student

## 2023-05-14 ENCOUNTER — Encounter: Payer: Self-pay | Admitting: Student

## 2023-05-14 VITALS — BP 110/62 | HR 104 | Ht 64.5 in | Wt 160.4 lb

## 2023-05-14 DIAGNOSIS — E785 Hyperlipidemia, unspecified: Secondary | ICD-10-CM

## 2023-05-14 DIAGNOSIS — I471 Supraventricular tachycardia, unspecified: Secondary | ICD-10-CM | POA: Diagnosis not present

## 2023-05-14 DIAGNOSIS — I4719 Other supraventricular tachycardia: Secondary | ICD-10-CM | POA: Diagnosis not present

## 2023-05-14 DIAGNOSIS — I351 Nonrheumatic aortic (valve) insufficiency: Secondary | ICD-10-CM

## 2023-05-14 DIAGNOSIS — I341 Nonrheumatic mitral (valve) prolapse: Secondary | ICD-10-CM

## 2023-05-14 DIAGNOSIS — I340 Nonrheumatic mitral (valve) insufficiency: Secondary | ICD-10-CM

## 2023-05-14 NOTE — Patient Instructions (Signed)
Medication Instructions:  Increase your Toprol 25 mg to 50 mg for the evening dose  *If you need a refill on your cardiac medications before your next appointment, please call your pharmacy*   Lab Work: MAGNESIUM If you have labs (blood work) drawn today and your tests are completely normal, you will receive your results only by: MyChart Message (if you have MyChart) OR A paper copy in the mail If you have any lab test that is abnormal or we need to change your treatment, we will call you to review the results.   Testing/Procedures:     Dear Yolanda Grant  You are scheduled for a Cardioversion on Wednesday, September 4 with Dr. Jacques Navy.  Please arrive at the [facility] (Main Entrance A) at [facility]: [address] [place], [place] [address] at 7:45 (This time is 2 hour(s) before your procedure to ensure your preparation). Free valet parking service is available. You will check in at ADMITTING. The support person will be asked to wait in the waiting room.  It is OK to have someone drop you off and come back when you are ready to be discharged.     DIET:   Nothing to eat or drink after midnight except a sip of water with medications         :1}Continue taking your anticoagulant (blood thinner): Apixaban (Eliquis).  You will need to continue this after your procedure until you are told by your provider that it is safe to stop.    FYI:  For your safety, and to allow [place] to monitor your vital signs accurately during the surgery/procedure we request: If you have artificial nails, gel coating, SNS etc, please have those removed prior to your surgery/procedure. Not having the nail coverings /polish removed may result in cancellation or delay of your surgery/procedure.  You must have a responsible person to drive you home and stay in the waiting area during your procedure. Failure to do so could result in cancellation.  Bring your insurance cards.  *Special Note: Every effort  is made to have your procedure done on time. Occasionally there are emergencies that occur at the hospital that may cause delays. Please be patient if a delay does occur.       Follow-Up: At [facility], you and your health needs are our priority.  As part of our continuing mission to provide you with exceptional heart care, we have created designated Provider Care Teams.  These Care Teams include your primary Cardiologist (physician) and Advanced Practice Providers (APPs -  Physician Assistants and Nurse Practitioners) who all work together to provide you with the care you need, when you need it.  We recommend signing up for the patient portal called "MyChart".  Sign up information is provided on this After Visit Summary.  MyChart is used to connect with patients for Virtual Visits (Telemedicine).  Patients are able to view lab/test results, encounter notes, upcoming appointments, etc.  Non-urgent messages can be sent to your provider as well.   To learn more about what you can do with MyChart, go to [email or web address].    Your next appointment:   1-2 week(s) AFTER THE CARDIOVERSION. PLEASE CALL THE OFFICE FOR THIS APPOINTMENT.  Provider:   Marjie Skiff, PA-C

## 2023-05-22 ENCOUNTER — Telehealth: Payer: Self-pay | Admitting: Cardiology

## 2023-05-22 NOTE — Telephone Encounter (Signed)
Returned pt call. She is having a Cardioversion on the 4th of September she wanted to know if she can have it done with having breast implants. She also wants to know what foods and supplements interact with her Eliquis. Her BP has been 80/61 Hr 86, 82/68 Hr 95, 96/69 Hr 77, 96/80 Hr 86, 88/63 Hr 81 She has been taking these at different times throughout the day as instructed. She is on Metoprolol she states and she takes 50mg  in the morning and at night.  Advised her of the following information:  Eliquis can interact with certain foods and supplements, such as grapefruit, turmeric, ginger, herbal tea, and St. John's wort. These interactions may increase your bleeding risk or affect the effectiveness of the medication and that she can more than likely still have the Cardioversion but will send to Dr. Antoine Poche and his nurse for recommendation.   Please advise

## 2023-05-22 NOTE — Telephone Encounter (Signed)
Patient calling with question concerning her procedure in Sept. Please advise

## 2023-05-23 NOTE — Telephone Encounter (Signed)
Returned  pt call and advised her of Dr. Jenene Slicker advice which it is ok to have the Cardioversion. Pt verbalized understanding.

## 2023-06-07 ENCOUNTER — Ambulatory Visit
Admission: RE | Admit: 2023-06-07 | Discharge: 2023-06-07 | Disposition: A | Discharge status: Home / Self Care | Payer: Medicare HMO | Source: Ambulatory Visit | Attending: Internal Medicine | Admitting: Internal Medicine

## 2023-06-07 DIAGNOSIS — Z1231 Encounter for screening mammogram for malignant neoplasm of breast: Secondary | ICD-10-CM | POA: Diagnosis not present

## 2023-06-11 NOTE — Progress Notes (Signed)
Spoke to pt and instructed them to come at 0815 and to be NPO after 0000. Confirmed no missed doses of AC and instructed to take in AM with a small sip of water.   Confirmed that pt will have a ride home and someone to stay with them for 24 hours after the procedure.

## 2023-06-12 ENCOUNTER — Ambulatory Visit (HOSPITAL_BASED_OUTPATIENT_CLINIC_OR_DEPARTMENT_OTHER): Payer: Medicare HMO | Admitting: Certified Registered"

## 2023-06-12 ENCOUNTER — Ambulatory Visit (HOSPITAL_COMMUNITY)
Admission: RE | Admit: 2023-06-12 | Discharge: 2023-06-12 | Disposition: A | Discharge status: Home / Self Care | Payer: Medicare HMO | Attending: Internal Medicine | Admitting: Internal Medicine

## 2023-06-12 ENCOUNTER — Other Ambulatory Visit: Payer: Self-pay

## 2023-06-12 ENCOUNTER — Encounter (HOSPITAL_COMMUNITY)
Admission: RE | Disposition: A | Discharge status: Home / Self Care | Payer: Self-pay | Source: Home / Self Care | Attending: Internal Medicine

## 2023-06-12 ENCOUNTER — Ambulatory Visit (HOSPITAL_COMMUNITY): Payer: Medicare HMO | Admitting: Certified Registered"

## 2023-06-12 DIAGNOSIS — Z7901 Long term (current) use of anticoagulants: Secondary | ICD-10-CM | POA: Insufficient documentation

## 2023-06-12 DIAGNOSIS — I4719 Other supraventricular tachycardia: Secondary | ICD-10-CM | POA: Diagnosis not present

## 2023-06-12 DIAGNOSIS — Z79899 Other long term (current) drug therapy: Secondary | ICD-10-CM | POA: Diagnosis not present

## 2023-06-12 DIAGNOSIS — E785 Hyperlipidemia, unspecified: Secondary | ICD-10-CM | POA: Diagnosis not present

## 2023-06-12 DIAGNOSIS — I4891 Unspecified atrial fibrillation: Secondary | ICD-10-CM

## 2023-06-12 DIAGNOSIS — I080 Rheumatic disorders of both mitral and aortic valves: Secondary | ICD-10-CM | POA: Diagnosis not present

## 2023-06-12 HISTORY — PX: CARDIOVERSION: SHX1299

## 2023-06-12 SURGERY — CARDIOVERSION
Anesthesia: General

## 2023-06-12 MED ORDER — SODIUM CHLORIDE 0.9 % IV SOLN: INTRAVENOUS | Status: DC

## 2023-06-12 MED ORDER — PROPOFOL 10 MG/ML IV BOLUS
INTRAVENOUS | Status: DC | PRN
Start: 2023-06-12 — End: 2023-06-12
  Administered 2023-06-12: 60 mg via INTRAVENOUS

## 2023-06-12 MED ORDER — LIDOCAINE 2% (20 MG/ML) 5 ML SYRINGE
INTRAMUSCULAR | Status: DC | PRN
  Administered 2023-06-12: 80 mg via INTRAVENOUS

## 2023-06-12 SURGICAL SUPPLY — 1 items: ELECT DEFIB PAD ADLT CADENCE (PAD) ×1 IMPLANT

## 2023-06-12 NOTE — CV Procedure (Signed)
Procedure: Electrical Cardioversion Indications:  Atrial Fibrillation  Procedure Details:  Consent: Risks of procedure as well as the alternatives and risks of each were explained to the (patient/caregiver).  Consent for procedure obtained.  Time Out: Verified patient identification, verified procedure, site/side was marked, verified correct patient position, special equipment/implants available, medications/allergies/relevent history reviewed, required imaging and test results available. PERFORMED.  Patient placed on cardiac monitor, pulse oximetry, supplemental oxygen as necessary.  Sedation given:  propofol per anesthesia Pacer pads placed anterior and posterior chest.  Cardioverted 1 time(s).  Cardioversion with synchronized biphasic 120J shock.  Evaluation: Findings: Post procedure EKG shows:  Ectopic atrial rhythm.  Complications: None Patient did tolerate procedure well.  Time Spent Directly with the Patient:  20 minutes   Parke Poisson 06/12/2023, 9:17 AM

## 2023-06-12 NOTE — Transfer of Care (Signed)
Immediate Anesthesia Transfer of Care Note  Patient: Yolanda Grant  Procedure(s) Performed: CARDIOVERSION  Patient Location: PACU and Cath Lab  Anesthesia Type:General  Level of Consciousness: awake  Airway & Oxygen Therapy: Patient Spontanous Breathing and Patient connected to nasal cannula oxygen  Post-op Assessment: Report given to RN and Post -op Vital signs reviewed and stable  Post vital signs: Reviewed and stable  Last Vitals:  Vitals Value Taken Time  BP    Temp    Pulse 85 06/12/23 0901  Resp 26 06/12/23 0901  SpO2 97 % 06/12/23 0901  Vitals shown include unfiled device data.  Last Pain:  Vitals:   06/12/23 0826  TempSrc:   PainSc: 0-No pain         Complications: No notable events documented.

## 2023-06-12 NOTE — Interval H&P Note (Signed)
History and Physical Interval Note:  06/12/2023 8:58 AM  Yolanda Grant  has presented today for surgery, with the diagnosis of AFIB.  The various methods of treatment have been discussed with the patient and family. After consideration of risks, benefits and other options for treatment, the patient has consented to  Procedure(s): CARDIOVERSION (N/A) as a surgical intervention.  The patient's history has been reviewed, patient examined, no change in status, stable for surgery.  I have reviewed the patient's chart and labs.  Questions were answered to the patient's satisfaction.     Parke Poisson

## 2023-06-13 ENCOUNTER — Encounter (HOSPITAL_COMMUNITY): Payer: Self-pay | Admitting: Internal Medicine

## 2023-06-13 NOTE — Anesthesia Postprocedure Evaluation (Signed)
Anesthesia Post Note  Patient: Yolanda Grant  Procedure(s) Performed: CARDIOVERSION     Patient location during evaluation: Cath Lab Anesthesia Type: General Level of consciousness: awake and alert Pain management: pain level controlled Vital Signs Assessment: post-procedure vital signs reviewed and stable Respiratory status: spontaneous breathing, nonlabored ventilation and respiratory function stable Cardiovascular status: blood pressure returned to baseline and stable Postop Assessment: no apparent nausea or vomiting Anesthetic complications: no   No notable events documented.  Last Vitals:  Vitals:   06/12/23 0930 06/12/23 0935  BP: 103/63 (!) 102/43  Pulse: 86 86  Resp: 15 (!) 25  Temp: 36.7 C   SpO2: 92% 90%    Last Pain:  Vitals:   06/12/23 0935  TempSrc:   PainSc: 0-No pain                 [name]

## 2023-06-13 NOTE — Anesthesia Preprocedure Evaluation (Signed)
Anesthesia Evaluation  Patient identified by MRN, date of birth, ID band Patient awake    Reviewed: Allergy & Precautions, NPO status , Patient's Chart, lab work & pertinent test results  History of Anesthesia Complications Negative for: history of anesthetic complications  Airway Mallampati: III  TM Distance: >3 FB Neck ROM: Full    Dental  (+) Dental Advisory Given   Pulmonary neg shortness of breath, neg sleep apnea, neg COPD, neg recent URI   breath sounds clear to auscultation       Cardiovascular + dysrhythmias Atrial Fibrillation  Rhythm:Irregular     Neuro/Psych negative neurological ROS  negative psych ROS   GI/Hepatic Neg liver ROS,GERD  ,,  Endo/Other  negative endocrine ROS    Renal/GU negative Renal ROS     Musculoskeletal  (+) Arthritis ,    Abdominal   Peds  Hematology  (+) Blood dyscrasia eliquis   Anesthesia Other Findings   Reproductive/Obstetrics                             Anesthesia Physical Anesthesia Plan  ASA: 2  Anesthesia Plan: General   Post-op Pain Management: Minimal or no pain anticipated   Induction: Intravenous  PONV Risk Score and Plan: 3 and Treatment may vary due to age or medical condition  Airway Management Planned: Nasal Cannula, Natural Airway and Simple Face Mask  Additional Equipment: None  Intra-op Plan:   Post-operative Plan:   Informed Consent: I have reviewed the patients History and Physical, chart, labs and discussed the procedure including the risks, benefits and alternatives for the proposed anesthesia with the patient or authorized representative who has indicated his/her understanding and acceptance.     Dental advisory given  Plan Discussed with: CRNA  Anesthesia Plan Comments:        Anesthesia Quick Evaluation

## 2023-06-14 ENCOUNTER — Telehealth: Payer: Self-pay | Admitting: Cardiology

## 2023-06-14 NOTE — Telephone Encounter (Signed)
Spoke with patient scheduled with Dr. Antoine Poche on 9/12 at 3pm

## 2023-06-14 NOTE — Telephone Encounter (Signed)
Pt wanting to know if she needs to come back for a f/u appt after her cardioversion procedure she had. Please advise

## 2023-06-14 NOTE — Telephone Encounter (Signed)
Returned call to pt. Patient states she would like to know if she needs to come in for a follow up appointment. Pt states she can feel her heart beating at times and her HR got up to 114 once after the Cardioversion. She thought maybe she should come in to see if the Cardioversion worked. Please advise.

## 2023-06-19 NOTE — Progress Notes (Unsigned)
Cardiology Office Note:   Date:  06/20/2023  ID:  Yolanda Grant, DOB 23-Jan-1941, MRN [id number] PCP: Pincus Sanes, MD  [facility] Providers Cardiologist:  Rollene Rotunda, MD {  History of Present Illness:   Yolanda Grant is a 82 y.o. female  with a history of newly diagnosed atrial fibrillation on Eliquis, paroxysmal SVT noted on monitor in 01/2022, mitral valve prolapse with mitral regurgitation.  The patient has a long history of palpitations. Monitor in 01/2022 showed frequent short runs of SVT with longest episode lasting 12.8 seconds. She also has a history of mitral valve prolapse with associated MR. Last Echo in 11/2022 showed LVEF of 60-65% with mild LV, normal RV, no significant mitral valve prolapse and only trivial MR, and mild to moderate AI.  She was in fib prior to the last visit in early August.    She had cardioversion on September 4.  She was in sinus rhythm.  However, today she comes back and she is back in fibrillation.  She would not notice this.  Maybe at night she might noticed palpitations but she overall does not have any presyncope or syncope.  She is not having chest pressure, neck or arm discomfort.  She is not having any shortness of breath, PND or orthopnea.  She has had no weight gain.  She tolerates anticoagulation.  She watches her heart rate on her Watch and it seems to be well-controlled.  ROS: As stated in the HPI and negative for all other systems.  Studies Reviewed:    EKG:   EKG Interpretation Date/Time:  Thursday June 20 2023 15:37:02 EDT Ventricular Rate:  103 PR Interval:    QRS Duration:  68 QT Interval:  346 QTC Calculation: 453 R Axis:   -50  Text Interpretation: Atrial fibrillation with rapid ventricular response Left axis deviation Low voltage QRS Poor anterior R wave progression When compared with ECG of 12-Jun-2023 09:14, No significant change since last tracing Confirmed by Rollene Rotunda ([id number]) on 06/20/2023 3:56:17 PM      Risk Assessment/Calculations:    CHA2DS2-VASc Score = 3   This indicates a 3.2% annual risk of stroke. The patient's score is based upon: CHF History: 0 HTN History: 0 Diabetes History: 0 Stroke History: 0 Vascular Disease History: 0 Age Score: 2 Gender Score: 1       Physical Exam:   VS:  BP 108/70 (BP Location: Left Arm, Patient Position: Sitting, Cuff Size: Normal)   Pulse 85   Ht 5' 4.5" (1.638 m)   Wt 160 lb 12.8 oz (72.9 kg)   SpO2 99%   BMI 27.17 kg/m    Wt Readings from Last 3 Encounters:  06/20/23 160 lb 12.8 oz (72.9 kg)  06/12/23 157 lb (71.2 kg)  05/14/23 160 lb 6.4 oz (72.8 kg)     GEN: Well nourished, well developed in no acute distress NECK: No JVD; No carotid bruits CARDIAC: Irregular RR, no murmurs, rubs, gallops RESPIRATORY:  Clear to auscultation without rales, wheezing or rhonchi  ABDOMEN: Soft, non-tender, non-distended EXTREMITIES:  No edema; No deformity   ASSESSMENT AND PLAN:   New Onset Atrial Fibrillation:   The patient is back in atrial fibrillation.  She really does not notice this.  She has a Watch and her heart rate is well-controlled.  Therefore, I would not suggest any attempts further at rhythm management.  She will continue with her anticoagulation.  She has good rate control as mentioned on the current beta-blocker.  No change in therapy.  We reviewed this.  We had a detailed conversation about this strategy in the office today.  Paroxysmal SVT: She has had no symptomatic PSVT.  No change in therapy.   Mitral Valve Prolapse/MR:  There was only trivial MR on echo in Feb 2024 with no MVP.     Mitral Regurgitation: As above.   Mild to Moderate Aortic Insufficiency:  She will have repeat echo probably in 2026.    Hyperlipidemia: LDL was 100, HDL 63.  No change in therapy.         Follow up with APP in six month.   Signed, Rollene Rotunda, MD

## 2023-06-20 ENCOUNTER — Encounter: Payer: Self-pay | Admitting: Cardiology

## 2023-06-20 ENCOUNTER — Ambulatory Visit: Payer: Medicare HMO | Attending: Cardiology | Admitting: Cardiology

## 2023-06-20 VITALS — BP 108/70 | HR 85 | Ht 64.5 in | Wt 160.8 lb

## 2023-06-20 DIAGNOSIS — I351 Nonrheumatic aortic (valve) insufficiency: Secondary | ICD-10-CM

## 2023-06-20 DIAGNOSIS — I480 Paroxysmal atrial fibrillation: Secondary | ICD-10-CM | POA: Diagnosis not present

## 2023-06-20 DIAGNOSIS — E785 Hyperlipidemia, unspecified: Secondary | ICD-10-CM

## 2023-06-20 MED ORDER — APIXABAN 5 MG PO TABS: 5.0000 mg | ORAL_TABLET | Freq: Two times a day (BID) | ORAL | 2 refills | Status: DC

## 2023-06-20 MED ORDER — METOPROLOL SUCCINATE ER 50 MG PO TB24: 50.0000 mg | ORAL_TABLET | Freq: Two times a day (BID) | ORAL | 3 refills | Status: DC

## 2023-06-20 NOTE — Patient Instructions (Addendum)
Medication Instructions:   No  changes    Metoprolol succinate 50 mg twice a day    *If you need a refill on your cardiac medications before your next appointment, please call your pharmacy*   Lab Work:  Not needed   Testing/Procedures:    Follow-Up: At [facility], you and your health needs are our priority.  As part of our continuing mission to provide you with exceptional heart care, we have created designated Provider Care Teams.  These Care Teams include your primary Cardiologist (physician) and Advanced Practice Providers (APPs -  Physician Assistants and Nurse Practitioners) who all work together to provide you with the care you need, when you need it.  We recommend signing up for the patient portal called "MyChart".  Sign up information is provided on this After Visit Summary.  MyChart is used to connect with patients for Virtual Visits (Telemedicine).  Patients are able to view lab/test results, encounter notes, upcoming appointments, etc.  Non-urgent messages can be sent to your provider as well.   To learn more about what you can do with MyChart, go to [email or web address].    Your next appointment:   6 month(s)  The format for your next appointment:   In Person  Provider:   Marjie Skiff, PA-C or Juanda Crumble, PA-C

## 2023-06-28 ENCOUNTER — Telehealth: Payer: Self-pay | Admitting: Cardiology

## 2023-06-28 NOTE — Telephone Encounter (Signed)
Spoke with patient and she is aware she can continue Raloxifene per Pharm-D recommendations. She verbalized understanding

## 2023-06-28 NOTE — Telephone Encounter (Signed)
Raloxifene has a boxed warning for increased risk of death due to stroke which occurred in a trial of post-menopausal women with either documented coronary heart disease or at increased risk for coronary events (which included pts with afib - however she is on Eliquis for stroke protection). She does not have coronary heart disease. Raloxifene can also increase the risk of DVT and PE, however she is also protected from this since she's on Eliquis already. Ok for her to continue raloxifene.

## 2023-06-28 NOTE — Telephone Encounter (Signed)
Patient states she has been taking raloxifene and she read on the box that she should not be taking this medication if you have AFIB. She would like to know if she needs to d/c the medication because of her AFIB?

## 2023-06-28 NOTE — Telephone Encounter (Signed)
Pt c/o medication issue:  1. Name of Medication:   raloxifene (EVISTA) 60 MG tablet    2. How are you currently taking this medication (dosage and times per day)?   Take 1 tablet (60 mg total) by mouth daily.     3. Are you having a reaction (difficulty breathing--STAT)? No  4. What is your medication issue? Pt states she is now taking Eliquis and wants to know if she should continue taking the above medication. Please advise

## 2023-08-27 ENCOUNTER — Ambulatory Visit (INDEPENDENT_AMBULATORY_CARE_PROVIDER_SITE_OTHER): Payer: Medicare HMO | Admitting: Internal Medicine

## 2023-08-27 ENCOUNTER — Encounter: Payer: Self-pay | Admitting: Internal Medicine

## 2023-08-27 VITALS — BP 110/74 | HR 80 | Temp 97.9°F | Ht 64.5 in | Wt 162.0 lb

## 2023-08-27 DIAGNOSIS — B372 Candidiasis of skin and nail: Secondary | ICD-10-CM | POA: Diagnosis not present

## 2023-08-27 MED ORDER — KETOCONAZOLE 2 % EX CREA: 1.0000 | TOPICAL_CREAM | Freq: Every day | CUTANEOUS | 0 refills | Status: DC | PRN

## 2023-08-27 NOTE — Progress Notes (Signed)
Subjective:    Patient ID: Yolanda Grant, female    DOB: 1941/08/03, 82 y.o.   MRN: [id number]      HPI La is here for  Chief Complaint  Patient presents with   Red spot underneath breast plate    Patient noticed red spot underneath breastplate on the right side     Implants years ago - silicone.  Had them removed and saline implants placed.  - they do sag, but has never had a problem.  About a week ago she noticed a red spot underneath the right breast.  It does not itch and it does not hurt.  She was concerned because it was very red.  It may be a little bit better, but it is still there so she knew she had to come have it evaluated.       Medications and allergies reviewed with patient and updated if appropriate.  Current Outpatient Medications on File Prior to Visit  Medication Sig Dispense Refill   apixaban (ELIQUIS) 5 MG TABS tablet Take 1 tablet (5 mg total) by mouth 2 (two) times daily. 180 tablet 2   Biotin 91478 MCG TABS Take 10,000 mcg by mouth in the morning.     Cranberry 500 MG CAPS Take 500 mg by mouth every evening.     D-Mannose POWD Take 1 Dose by mouth daily.     ELDERBERRY PO Take 50 mg by mouth daily.     famotidine (PEPCID) 20 MG tablet Take 1 tablet (20 mg total) by mouth daily.     hydrochlorothiazide (HYDRODIURIL) 12.5 MG tablet Take 12.5 mg by mouth as needed (for lower extremity swelling.).     METAMUCIL FIBER PO Take 1 Scoop by mouth daily as needed (constipation).     metoprolol succinate (TOPROL-XL) 50 MG 24 hr tablet Take 1 tablet (50 mg total) by mouth 2 (two) times daily. Take with or immediately following a meal. 180 tablet 3   Multiple Minerals-Vitamins (CALCIUM-MAGNESIUM-ZINC-D3) TABS Take 1 tablet by mouth 2 (two) times daily.     Multiple Vitamin (ONE-DAILY MULTI-VITAMIN) TABS Take 1 tablet by mouth daily.     Polyethyl Glycol-Propyl Glycol (SYSTANE ULTRA) 0.4-0.3 % SOLN Place 1 drop into both eyes 2 (two) times daily.      pravastatin (PRAVACHOL) 20 MG tablet Take 1 tablet (20 mg total) by mouth daily. (Patient taking differently: Take 20 mg by mouth at bedtime.) 90 tablet 3   Probiotic Product (PROBIOTIC PO) Take 1 capsule by mouth daily.     raloxifene (EVISTA) 60 MG tablet Take 1 tablet (60 mg total) by mouth daily. 90 tablet 3   No current facility-administered medications on file prior to visit.    Review of Systems     Objective:   Vitals:   08/27/23 0951  BP: 110/74  Pulse: 80  Temp: 97.9 F (36.6 C)  SpO2: 98%   BP Readings from Last 3 Encounters:  08/27/23 110/74  06/20/23 108/70  06/12/23 (!) 102/43   Wt Readings from Last 3 Encounters:  08/27/23 162 lb (73.5 kg)  06/20/23 160 lb 12.8 oz (72.9 kg)  06/12/23 157 lb (71.2 kg)   Body mass index is 27.38 kg/m.    Physical Exam Constitutional:      General: She is not in acute distress.    Appearance: Normal appearance. She is not ill-appearing.  HENT:     Head: Normocephalic and atraumatic.  Skin:    General: Skin is warm  and dry.     Findings: Erythema (Area approximately 5 cm x 3 cm of erythema under right breast-no breaks in skin, no satellite lesions, no warmth, nontender) present.  Neurological:     Mental Status: She is alert.            Assessment & Plan:    See Problem List for Assessment and Plan of chronic medical problems.

## 2023-08-27 NOTE — Assessment & Plan Note (Signed)
Acute  Rash consistent with probable yeast infection Discussed it is related to moisture and she will tried to dry under their better Start ketoconazole 2% cream daily as needed-advised that she can use this intermittently for recurrences She will call with any questions or concerns

## 2023-08-27 NOTE — Patient Instructions (Addendum)
      Medications changes include :   ketoconazole cream daily as needed     Return if symptoms worsen or fail to improve.

## 2023-09-23 DIAGNOSIS — H43812 Vitreous degeneration, left eye: Secondary | ICD-10-CM | POA: Diagnosis not present

## 2023-09-23 DIAGNOSIS — H31092 Other chorioretinal scars, left eye: Secondary | ICD-10-CM | POA: Diagnosis not present

## 2023-09-23 DIAGNOSIS — Z961 Presence of intraocular lens: Secondary | ICD-10-CM | POA: Diagnosis not present

## 2023-11-04 ENCOUNTER — Ambulatory Visit (INDEPENDENT_AMBULATORY_CARE_PROVIDER_SITE_OTHER): Payer: HMO

## 2023-11-04 VITALS — BP 110/80 | HR 72 | Ht 64.5 in | Wt 171.2 lb

## 2023-11-04 DIAGNOSIS — Z0000 Encounter for general adult medical examination without abnormal findings: Secondary | ICD-10-CM | POA: Diagnosis not present

## 2023-11-04 DIAGNOSIS — Z780 Asymptomatic menopausal state: Secondary | ICD-10-CM

## 2023-11-04 NOTE — Patient Instructions (Addendum)
Yolanda Grant , Thank you for taking time to come for your Medicare Wellness Visit. I appreciate your ongoing commitment to your health goals. Please review the following plan we discussed and let me know if I can assist you in the future.   Referrals/Orders/Follow-Ups/Clinician Recommendations: It was nice to meet you today.  I wish you a very Happy birthday.  Keep up the good work.  You have an order for:   [x]   Bone Density     Please call for appointment:  [facility] [address] [place], [place] [address] [phone]   Make sure to wear two-piece clothing.  No lotions, powders, or deodorants the day of the appointment. Make sure to bring picture ID and insurance card.  Bring list of medications you are currently taking including any supplements.   Schedule your [place] screening mammogram through MyChart!   Log into your MyChart account.  Go to 'Visit' (or 'Appointments' if on mobile App) --> Schedule an Appointment  Under 'Select a Reason for Visit' choose the Mammogram Screening option.  Complete the pre-visit questions and select the time and place that best fits your schedule.    This is a list of the screening recommended for you and due dates:  Health Maintenance  Topic Date Due   COVID-19 Vaccine (3 - Moderna risk series) 09/02/2020   Flu Shot  01/06/2024*   DEXA scan (bone density measurement)  06/06/2024   Medicare Annual Wellness Visit  11/03/2024   DTaP/Tdap/Td vaccine (3 - Td or Tdap) 02/14/2025   Pneumonia Vaccine  Completed   Zoster (Shingles) Vaccine  Completed   HPV Vaccine  Aged Out  *Topic was postponed. The date shown is not the original due date.    Advanced directives: (Copy Requested) Please bring a copy of your health care power of attorney and living will to the office to be added to your chart at your convenience.  Next Medicare Annual Wellness Visit scheduled for next year: Yes

## 2023-11-04 NOTE — Progress Notes (Signed)
Subjective:   Yolanda Grant is a 83 y.o. female who presents for Medicare Annual (Subsequent) preventive examination.  Visit Complete: In person  Patient Medicare AWV questionnaire was completed by the patient on 10/31/2023; I have confirmed that all information answered by patient is correct and no changes since this date.  Cardiac Risk Factors include: advanced age (>106men, >71 women);Other (see comment);dyslipidemia, Risk factor comments: Paroxysmal atrial tachycardia, MVP, SVT ,Aortic insufficiency     Objective:    Today's Vitals   11/04/23 1020  BP: 110/80  Pulse: 72  SpO2: 99%  Weight: 171 lb 3.2 oz (77.7 kg)  Height: 5' 4.5" (1.638 m)   Body mass index is 28.93 kg/m.     11/04/2023   10:24 AM 06/12/2023    8:28 AM 04/04/2022    3:19 PM 04/03/2021    4:06 PM  Advanced Directives  Does Patient Have a Medical Advance Directive? Yes No Yes Yes  Type of Forensic scientist of [employer] Power of Attorney  Does patient want to make changes to medical advance directive?   No - Patient declined No - Patient declined  Copy of Healthcare Power of Attorney in Chart?   No - copy requested No - copy requested    Current Medications (verified) Outpatient Encounter Medications as of 11/04/2023  Medication Sig   apixaban (ELIQUIS) 5 MG TABS tablet Take 1 tablet (5 mg total) by mouth 2 (two) times daily.   Biotin 16109 MCG TABS Take 10,000 mcg by mouth in the morning.   Cranberry 500 MG CAPS Take 500 mg by mouth every evening.   D-Mannose POWD Take 1 Dose by mouth daily.   ELDERBERRY PO Take 50 mg by mouth daily.   famotidine (PEPCID) 20 MG tablet Take 1 tablet (20 mg total) by mouth daily.   hydrochlorothiazide (HYDRODIURIL) 12.5 MG tablet Take 12.5 mg by mouth as needed (for lower extremity swelling.).   ketoconazole (NIZORAL) 2 % cream Apply 1 Application topically daily as needed for irritation.   METAMUCIL FIBER PO Take  1 Scoop by mouth daily as needed (constipation).   metoprolol succinate (TOPROL-XL) 50 MG 24 hr tablet Take 1 tablet (50 mg total) by mouth 2 (two) times daily. Take with or immediately following a meal.   Multiple Minerals-Vitamins (CALCIUM-MAGNESIUM-ZINC-D3) TABS Take 1 tablet by mouth 2 (two) times daily.   Multiple Vitamin (ONE-DAILY MULTI-VITAMIN) TABS Take 1 tablet by mouth daily.   Polyethyl Glycol-Propyl Glycol (SYSTANE ULTRA) 0.4-0.3 % SOLN Place 1 drop into both eyes 2 (two) times daily.   pravastatin (PRAVACHOL) 20 MG tablet Take 1 tablet (20 mg total) by mouth daily. (Patient taking differently: Take 20 mg by mouth at bedtime.)   Probiotic Product (PROBIOTIC PO) Take 1 capsule by mouth daily.   raloxifene (EVISTA) 60 MG tablet Take 1 tablet (60 mg total) by mouth daily.   No facility-administered encounter medications on file as of 11/04/2023.    Allergies (verified) Sulfa antibiotics and Augmentin [amoxicillin-pot clavulanate]   History: Past Medical History:  Diagnosis Date   Congenital spondylolisthesis of lumbar region    Hyperlipidemia    Mitral valve prolapse    Spinal stenosis    Past Surgical History:  Procedure Laterality Date   ABDOMINAL HYSTERECTOMY     anemia, heavy bleeding   AUGMENTATION MAMMAPLASTY     BREAST BIOPSY     BREAST RECONSTRUCTION WITH PLACEMENT OF TISSUE EXPANDER AND ALLODERM  CARDIOVERSION N/A 06/12/2023   Procedure: CARDIOVERSION;  Surgeon: Parke Poisson, MD;  Location: [facility] INVASIVE CV LAB;  Service: Cardiovascular;  Laterality: N/A;   CATARACT EXTRACTION Left 12/28/2021   CATARACT EXTRACTION Right 02/13/2022   TUBAL LIGATION     Family History  Problem Relation Age of Onset   Breast cancer Mother    Social History   Socioeconomic History   Marital status: Widowed    Spouse name: Not on file   Number of children: Not on file   Years of education: Not on file   Highest education level: Not on file  Occupational History    Occupation: RETIRED  Tobacco Use   Smoking status: Never   Smokeless tobacco: Never  Vaping Use   Vaping status: Never Used  Substance and Sexual Activity   Alcohol use: Not Currently   Drug use: Never   Sexual activity: Not on file  Other Topics Concern   Not on file  Social History Narrative   Lives alone-2025   Social Drivers of Health   Financial Resource Strain: Low Risk  (11/04/2023)   Overall Financial Resource Strain (CARDIA)    Difficulty of Paying Living Expenses: Not very hard  Food Insecurity: No Food Insecurity (11/04/2023)   Hunger Vital Sign    Worried About Running Out of Food in the Last Year: Never true    Ran Out of Food in the Last Year: Never true  Transportation Needs: No Transportation Needs (11/04/2023)   PRAPARE - Administrator, Civil Service (Medical): No    Lack of Transportation (Non-Medical): No  Physical Activity: Sufficiently Active (11/04/2023)   Exercise Vital Sign    Days of Exercise per Week: 5 days    Minutes of Exercise per Session: 60 min  Stress: No Stress Concern Present (11/04/2023)   [employer] of Occupational Health - Occupational Stress Questionnaire    Feeling of Stress : Not at all  Social Connections: Moderately Integrated (11/04/2023)   Social Connection and Isolation Panel [NHANES]    Frequency of Communication with Friends and Family: More than three times a week    Frequency of Social Gatherings with Friends and Family: Twice a week    Attends Religious Services: More than 4 times per year    Active Member of [employer] or Organizations: Yes    Attends Banker Meetings: Never    Marital Status: Widowed    Tobacco Counseling Counseling given: Not Answered   Clinical Intake:  Pre-visit preparation completed: Yes  Pain : No/denies pain     BMI - recorded: 28.93 Nutritional Status: BMI 25 -29 Overweight Nutritional Risks: None Diabetes: No  How often do you need to have someone help you  when you read instructions, pamphlets, or other written materials from your doctor or pharmacy?: 1 - Never     Information entered by :: Marlet Korte, RMA   Activities of Daily Living    10/31/2023   10:19 PM 06/12/2023    8:23 AM  In your present state of health, do you have any difficulty performing the following activities:  Hearing? 0 0  Vision? 0 0  Difficulty concentrating or making decisions? 0 0  Walking or climbing stairs? 0 0  Dressing or bathing? 0 0  Doing errands, shopping? 0   Preparing Food and eating ? N   Using the Toilet? N   In the past six months, have you accidently leaked urine? N   Do you have problems  with loss of bowel control? N   Managing your Medications? N   Managing your Finances? N   Housekeeping or managing your Housekeeping? N     Patient Care Team: Pincus Sanes, MD as PCP - General (Internal Medicine) Rollene Rotunda, MD as PCP - Cardiology (Cardiology) Sallye Lat, MD as Consulting Physician (Ophthalmology)  Indicate any recent Medical Services you may have received from other than [facility] providers in the past year (date may be approximate).     Assessment:   This is a routine wellness examination for Yolanda Grant.  Hearing/Vision screen Hearing Screening - Comments:: Denies hearing difficulties   Vision Screening - Comments:: Denies vision issues.    Goals Addressed               This Visit's Progress     Patient Stated (pt-stated)   On track     My goal is to lose 15 pounds by watching my diet and being more physically active.      Depression Screen    11/04/2023   10:28 AM 05/13/2023   10:23 AM 12/14/2022    1:08 PM 05/10/2022   10:33 AM 04/04/2022    3:15 PM 04/03/2021    4:01 PM 11/07/2020    5:01 PM  PHQ 2/9 Scores  PHQ - 2 Score 0 0 0 0 0 0 0  PHQ- 9 Score 0 0 0 0   0    Fall Risk    10/31/2023   10:19 PM 05/13/2023   10:11 AM 12/14/2022    1:08 PM 05/10/2022   10:32 AM 04/04/2022    3:20 PM  Fall Risk   Falls in  the past year? 0 0 0 0 0  Number falls in past yr:  0 0 0 0  Injury with Fall?  0 0 0 0  Risk for fall due to :  No Fall Risks No Fall Risks No Fall Risks No Fall Risks  Follow up Falls evaluation completed;Falls prevention discussed Falls evaluation completed Falls evaluation completed Falls evaluation completed Falls evaluation completed    MEDICARE RISK AT HOME: Medicare Risk at Home Any stairs in or around the home?: (Patient-Rptd) No Home free of loose throw rugs in walkways, pet beds, electrical cords, etc?: (Patient-Rptd) Yes Adequate lighting in your home to reduce risk of falls?: (Patient-Rptd) Yes Life alert?: (Patient-Rptd) No Use of a cane, walker or w/c?: (Patient-Rptd) No Grab bars in the bathroom?: (Patient-Rptd) No Shower chair or bench in shower?: (Patient-Rptd) No Elevated toilet seat or a handicapped toilet?: (Patient-Rptd) Yes  TIMED UP AND GO:  Was the test performed?  Yes  Length of time to ambulate 10 feet: 15 sec Gait steady and fast without use of assistive device    Cognitive Function:        11/04/2023   10:16 AM 04/04/2022    3:27 PM  6CIT Screen  What Year? 0 points 0 points  What month? 0 points 0 points  What time? 0 points 0 points  Count back from 20 0 points 0 points  Months in reverse 0 points 0 points  Repeat phrase 0 points 0 points  Total Score 0 points 0 points    Immunizations Immunization History  Administered Date(s) Administered   Fluad Quad(high Dose 65+) 07/16/2022, 07/12/2023   Influenza, High Dose Seasonal PF 07/24/2018   Influenza-Unspecified 05/08/2020, 07/26/2021   Moderna SARS-COV2 Booster Vaccination 08/05/2020   Moderna Sars-Covid-2 Vaccination 10/30/2019, 11/27/2019   Pneumococcal Conjugate-13 02/08/2014  Pneumococcal Polysaccharide-23 02/21/2018   Td 12/19/2006   Tdap 02/15/2015   Zoster Recombinant(Shingrix) 11/16/2020, 02/15/2021   Zoster, Live 01/13/2008    TDAP status: Up to date  Flu Vaccine status:  Up to date  Pneumococcal vaccine status: Up to date  Covid-19 vaccine status: Declined, Education has been provided regarding the importance of this vaccine but patient still declined. Advised may receive this vaccine at local pharmacy or Health Dept.or vaccine clinic. Aware to provide a copy of the vaccination record if obtained from local pharmacy or Health Dept. Verbalized acceptance and understanding.  Qualifies for Shingles Vaccine? Yes   Zostavax completed Yes   Shingrix Completed?: Yes  Screening Tests Health Maintenance  Topic Date Due   COVID-19 Vaccine (3 - Moderna risk series) 09/02/2020   DEXA SCAN  06/06/2024   Medicare Annual Wellness (AWV)  11/03/2024   DTaP/Tdap/Td (3 - Td or Tdap) 02/14/2025   Pneumonia Vaccine 75+ Years old  Completed   INFLUENZA VACCINE  Completed   Zoster Vaccines- Shingrix  Completed   HPV VACCINES  Aged Out    Health Maintenance  Health Maintenance Due  Topic Date Due   COVID-19 Vaccine (3 - Moderna risk series) 09/02/2020    Colorectal cancer screening: No longer required.   Mammogram status: Completed 06/11/2023. Repeat every year  Bone Density status: Ordered 11/04/2023. Pt provided with contact info and advised to call to schedule appt.  Lung Cancer Screening: (Low Dose CT Chest recommended if Age 59-80 years, 20 pack-year currently smoking OR have quit w/in 15years.) does not qualify.   Lung Cancer Screening Referral: N/A  Additional Screening:  Hepatitis C Screening: does not qualify;   Vision Screening: Recommended annual ophthalmology exams for early detection of glaucoma and other disorders of the eye. Is the patient up to date with their annual eye exam?  Yes  Who is the provider or what is the name of the office in which the patient attends annual eye exams? Dr. Dione Booze If pt is not established with a provider, would they like to be referred to a provider to establish care? No .   Dental Screening: Recommended annual  dental exams for proper oral hygiene   Community Resource Referral / Chronic Care Management: CRR required this visit?  No   CCM required this visit?  No     Plan:     I have personally reviewed and noted the following in the patient's chart:   Medical and social history Use of alcohol, tobacco or illicit drugs  Current medications and supplements including opioid prescriptions. Patient is not currently taking opioid prescriptions. Functional ability and status Nutritional status Physical activity Advanced directives List of other physicians Hospitalizations, surgeries, and ER visits in previous 12 months Vitals Screenings to include cognitive, depression, and falls Referrals and appointments  In addition, I have reviewed and discussed with patient certain preventive protocols, quality metrics, and best practice recommendations. A written personalized care plan for preventive services as well as general preventive health recommendations were provided to patient.     Kemberly Taves L Aarion Metzgar, CMA   11/04/2023   After Visit Summary: (MyChart) Due to this being a telephonic visit, the after visit summary with patients personalized plan was offered to patient via MyChart   Nurse Notes: Patient is due for a DEXA and order has been placed today.  Patient is up to date on all other health maintenance with no concerns to address today.

## 2023-12-09 DIAGNOSIS — L920 Granuloma annulare: Secondary | ICD-10-CM | POA: Diagnosis not present

## 2023-12-17 NOTE — Progress Notes (Unsigned)
  Cardiology Office Note   Date:  12/18/2023  ID:  Yolanda Grant, DOB 1941-01-24, MRN [id number] PCP:  Pincus Sanes, MD [facility] Cardiologist: Rollene Rotunda, MD  Reason for visit: 78-month follow-up  History of Present Illness    Yolanda Grant is a 83 y.o. female with a hx of atrial fibrillation, paroxysmal SVT, mitral valve prolapse with mitral regurgitation, aortic insufficiency.  Patient had a cardioversion September 2024.  She was last seen by Dr. Antoine Poche September 2024 following a cardioversion.  She converted to normal sinus rhythm however she was back in A-fib at her outpatient appointment.  Being fairly asymptomatic, Dr. Antoine Poche recommended rate control strategy and follow-up in 6 months.  Today, patient states she is doing well.  She states she will only feel brief heart pounding when she lays down to sleep but this is relieved if she coughs or takes a deep breath.  She follows her heart rate on her smart watch, heart rate typically in the 90s.  She states it is always less than 110.  Currently she takes Toprol-XL 50 mg twice daily for rate control.  She is taking Eliquis 5 mg twice daily without bleeding issues.  She denies chest pain, shortness of breath, lightheadedness, LE edema, PND and orthopnea.  Patient has an old prescription for hydrochlorothiazide as needed for leg swelling but she has not used in over a year.  She complains of stubborn weight gain in her abdomen.  Objective / Physical Exam   EKG today: Atrial fibrillation, heart rate 93  Vital signs:  BP 118/72   Pulse 93   Ht 5' 4.5" (1.638 m)   Wt 170 lb (77.1 kg)   SpO2 98%   BMI 28.73 kg/m     GEN: No acute distress NECK: No carotid bruits CARDIAC: Irregular irregular RESPIRATORY:  Clear to auscultation without rales, wheezing or rhonchi  EXTREMITIES: No edema  Assessment and Plan   Permanent atrial fibrillation -Cardioversion September 2024 but was back in A-fib by follow-up  appointment.  Rate control strategy pursued -EKG shows atrial fibrillation heart rate 93. -TSH August 2024 within normal limits. -Advised her to continue to monitor heart rate at rest and with activity. -I told her heart rate is at the upper end of normal.  She wishes to continue Toprol XL at current dose- 50 mg twice daily. -Continue Eliquis 5 mg twice daily for stroke prevention.  Mitral valve prolapse/MR -Trivial MR on echo February 2024, EF 65% -Euvolemic on exam.  Aortic insufficiency -Mild to moderate on echo February 2024 -Euvolemic on exam. -Plan repeat echo 2026  Hyperlipidemia -LDL 100 in August 2024.  Continue pravastatin 20 mg daily. -Recommend cholesterol lowering diets - Mediterranean diet, DASH diet, vegetarian diet, low-carbohydrate diet and avoidance of trans fats.  Discussed healthier choice substitutes.  Nuts, high-fiber foods, and fiber supplements may also improve lipids.   -Gave patient information sheet on healthy lifestyle as she complains about abdominal girth.  Disposition - Follow-up in 1 year with echo prior.     Signed, Cannon Kettle, PA-C  12/18/2023 [facility]

## 2023-12-18 ENCOUNTER — Ambulatory Visit: Payer: Medicare HMO | Attending: Physician Assistant | Admitting: Physician Assistant

## 2023-12-18 ENCOUNTER — Encounter: Payer: Self-pay | Admitting: Physician Assistant

## 2023-12-18 VITALS — BP 118/72 | HR 93 | Ht 64.5 in | Wt 170.0 lb

## 2023-12-18 DIAGNOSIS — I4821 Permanent atrial fibrillation: Secondary | ICD-10-CM | POA: Diagnosis not present

## 2023-12-18 DIAGNOSIS — I351 Nonrheumatic aortic (valve) insufficiency: Secondary | ICD-10-CM | POA: Diagnosis not present

## 2023-12-18 DIAGNOSIS — E785 Hyperlipidemia, unspecified: Secondary | ICD-10-CM

## 2023-12-18 DIAGNOSIS — I340 Nonrheumatic mitral (valve) insufficiency: Secondary | ICD-10-CM | POA: Diagnosis not present

## 2023-12-18 MED ORDER — APIXABAN 5 MG PO TABS: 5.0000 mg | ORAL_TABLET | Freq: Two times a day (BID) | ORAL | 2 refills | Status: DC

## 2023-12-18 NOTE — Patient Instructions (Signed)
 Medication Instructions:  No Changes *If you need a refill on your cardiac medications before your next appointment, please call your pharmacy*   Lab Work: No Labs  If you have labs (blood work) drawn today and your tests are completely normal, you will receive your results only by: MyChart Message (if you have MyChart) OR A paper copy in the mail If you have any lab test that is abnormal or we need to change your treatment, we will call you to review the results.   Testing/Procedures:[address], [address]. (March  2026) Your physician has requested that you have an echocardiogram. Echocardiography is a painless test that uses sound waves to create images of your heart. It provides your doctor with information about the size and shape of your heart and how well your heart's chambers and valves are working. This procedure takes approximately one hour. There are no restrictions for this procedure. Please do NOT wear cologne, perfume, aftershave, or lotions (deodorant is allowed). Please arrive 15 minutes prior to your appointment time.  Please note: We ask at that you not bring children with you during ultrasound (echo/ vascular) testing. Due to room size and safety concerns, children are not allowed in the ultrasound rooms during exams. Our front office staff cannot provide observation of children in our lobby area while testing is being conducted. An adult accompanying a patient to their appointment will only be allowed in the ultrasound room at the discretion of the ultrasound technician under special circumstances. We apologize for any inconvenience.    Follow-Up: At [facility], you and your health needs are our priority.  As part of our continuing mission to provide you with exceptional heart care, we have created designated Provider Care Teams.  These Care Teams include your primary Cardiologist (physician) and Advanced Practice Providers (APPs -  Physician  Assistants and Nurse Practitioners) who all work together to provide you with the care you need, when you need it.  We recommend signing up for the patient portal called "MyChart".  Sign up information is provided on this After Visit Summary.  MyChart is used to connect with patients for Virtual Visits (Telemedicine).  Patients are able to view lab/test results, encounter notes, upcoming appointments, etc.  Non-urgent messages can be sent to your provider as well.   To learn more about what you can do with MyChart, go to [email or web address].    Your next appointment:   1 year(s)  Provider:   Rollene Rotunda, MD    Other Instructions   1st Floor: - Lobby - Registration  - Pharmacy  - Lab - Cafe  2nd Floor: - PV Lab - Diagnostic Testing (echo, CT, nuclear med)  3rd Floor: - Vacant  4th Floor: - TCTS (cardiothoracic surgery) - AFib Clinic - Structural Heart Clinic - Vascular Surgery  - Vascular Ultrasound  5th Floor: - HeartCare Cardiology (general and EP) - Clinical Pharmacy for coumadin, hypertension, lipid, weight-loss medications, and med management appointments    Valet parking services will be available as well.

## 2024-03-19 ENCOUNTER — Telehealth: Payer: Self-pay | Admitting: Cardiology

## 2024-03-19 DIAGNOSIS — I4821 Permanent atrial fibrillation: Secondary | ICD-10-CM

## 2024-03-19 NOTE — Telephone Encounter (Signed)
 Pt would like a c/b from the nurse she would like to know if the AFIB clinic would be good for her and what type of food should she avoid.

## 2024-03-20 MED ORDER — HYDROCHLOROTHIAZIDE 12.5 MG PO TABS: 12.5000 mg | ORAL_TABLET | ORAL | 3 refills | Status: AC | PRN

## 2024-03-20 NOTE — Telephone Encounter (Signed)
 Yolanda Grates, MD to [facility] Triage     03/19/24  7:46 PM We would be happy to have her seen in the Atrial Fib Clinic if she has further questions but I agree with her last visit and I think yearly follow up would be fine.  There is no specific atrial fib diet.  Spoke with the patient and informed that a referral has been placed to the Afib clinic- they will call her to schedule an appointment. She requested a refill of hydrochlorothiazide  due to some occasional swelling. Discussed heart healthy diet and foods to avoid/excess that are high in vitamin K due to taking Eliquis . Also informed not to take any NSAIDs while taking Eliquis .   She verbalized understanding of all information.

## 2024-03-28 ENCOUNTER — Other Ambulatory Visit: Payer: Self-pay | Admitting: Cardiology

## 2024-04-06 ENCOUNTER — Ambulatory Visit (HOSPITAL_COMMUNITY)
Admission: RE | Admit: 2024-04-06 | Discharge: 2024-04-06 | Disposition: A | Discharge status: Home / Self Care | Source: Ambulatory Visit | Attending: Internal Medicine | Admitting: Internal Medicine

## 2024-04-06 VITALS — BP 116/76 | HR 85 | Ht 64.5 in | Wt 167.0 lb

## 2024-04-06 DIAGNOSIS — I4891 Unspecified atrial fibrillation: Secondary | ICD-10-CM | POA: Diagnosis not present

## 2024-04-06 DIAGNOSIS — I4821 Permanent atrial fibrillation: Secondary | ICD-10-CM

## 2024-04-06 DIAGNOSIS — D6869 Other thrombophilia: Secondary | ICD-10-CM

## 2024-04-06 NOTE — Progress Notes (Signed)
 Primary Care Physician: Geofm Glade PARAS, MD Primary Cardiologist: Lynwood Schilling, MD Electrophysiologist: None     Referring Physician: Dr. Schilling Yolanda Grant is a 83 y.o. female with a history of paroxysmal SVT, mitral valve prolapse, aortic insufficiency, HLD, and atrial fibrillation who presents for consultation in the [facility]. Patient in permanent Afib / rate control strategy. Patient is on Eliquis  5 mg BID for a CHADS2VASC score of 3.  On evaluation today, she is currently in permanent Afib. She has questions about diet / what is safe to eat while taking Eliquis . She also has questions about intermittent bilateral LE swelling, which she notes to have had before diagnosis of Afib. No missed doses of Eliquis .   Today, she denies symptoms of palpitations, chest pain, shortness of breath, orthopnea, PND, lower extremity edema, dizziness, presyncope, syncope, snoring, daytime somnolence, bleeding, or neurologic sequela. The patient is tolerating medications without difficulties and is otherwise without complaint today.    she has a BMI of Body mass index is 28.22 kg/m.SABRA Filed Weights   04/06/24 0946  Weight: 75.8 kg    Current Outpatient Medications  Medication Sig Dispense Refill   apixaban  (ELIQUIS ) 5 MG TABS tablet Take 1 tablet (5 mg total) by mouth 2 (two) times daily. 180 tablet 2   Biotin 89999 MCG TABS Take 10,000 mcg by mouth in the morning.     Coenzyme Q10 (CO Q 10 PO) Take 200 mg by mouth every morning.     Cranberry 500 MG CAPS Take 500 mg by mouth every evening.     D-Mannose POWD Take 1 Dose by mouth daily.     famotidine  (PEPCID ) 20 MG tablet Take 1 tablet (20 mg total) by mouth daily.     hydrochlorothiazide  (HYDRODIURIL ) 12.5 MG tablet Take 1 tablet (12.5 mg total) by mouth as needed (for lower extremity swelling.). 90 tablet 3   METAMUCIL FIBER PO Take 1 Scoop by mouth daily as needed (constipation).     metoprolol   succinate (TOPROL -XL) 50 MG 24 hr tablet Take 1 tablet (50 mg total) by mouth 2 (two) times daily. Take with or immediately following a meal. 180 tablet 3   Multiple Minerals-Vitamins (CALCIUM-MAGNESIUM-ZINC-D3) TABS Take 1 tablet by mouth 2 (two) times daily.     Multiple Vitamin (ONE-DAILY MULTI-VITAMIN) TABS Take 1 tablet by mouth daily.     Omega-3 Fatty Acids (OMEGA 3 FISH OIL PO) Take 1 capsule by mouth every morning. Ultimate Omega-1280 mg     Polyethyl Glycol-Propyl Glycol (SYSTANE ULTRA) 0.4-0.3 % SOLN Place 1 drop into both eyes 2 (two) times daily.     pravastatin  (PRAVACHOL ) 20 MG tablet TAKE 1 TABLET BY MOUTH EVERY DAY 90 tablet 2   Probiotic Product (PROBIOTIC PO) Take 1 capsule by mouth daily.     raloxifene  (EVISTA ) 60 MG tablet Take 1 tablet (60 mg total) by mouth daily. 90 tablet 3   No current facility-administered medications for this encounter.    Atrial Fibrillation Management history:  Previous antiarrhythmic drugs: none Previous cardioversions: 06/12/23 Previous ablations: none Anticoagulation history: Eliquis    ROS- All systems are reviewed and negative except as per the HPI above.  Physical Exam: BP 116/76   Pulse 85   Ht 5' 4.5 (1.638 m)   Wt 75.8 kg   BMI 28.22 kg/m   GEN: Well nourished, well developed in no acute distress NECK: No JVD; No carotid bruits CARDIAC: Irregularly irregular rate  and rhythm, no murmurs, rubs, gallops RESPIRATORY:  Clear to auscultation without rales, wheezing or rhonchi  ABDOMEN: Soft, non-tender, non-distended EXTREMITIES:  No edema; No deformity   EKG today demonstrates  Vent. rate 85 BPM PR interval * ms QRS duration 70 ms QT/QTcB 360/428 ms P-R-T axes * -61 42 Atrial fibrillation with premature ventricular or aberrantly conducted complexes Left axis deviation Low voltage QRS Cannot rule out Anteroseptal infarct (cited on or before 06-Apr-2024) Abnormal ECG When compared with ECG of 18-Dec-2023 09:14, Previous  ECG is present  Echo 11/30/22 demonstrated   1. Left ventricular ejection fraction, by estimation, is 60 to 65%. The  left ventricle has normal function. The left ventricle has no regional  wall motion abnormalities. There is mild concentric left ventricular  hypertrophy. Left ventricular diastolic  parameters were normal.   2. Right ventricular systolic function is normal. The right ventricular  size is normal. There is normal pulmonary artery systolic pressure.   3. The mitral valve is normal in structure. Trivial mitral valve  regurgitation. No evidence of mitral stenosis. Significant prolapse not  seen in this study.   4. The aortic valve was not well visualized. Aortic valve regurgitation  is mild to moderate. No aortic stenosis is present.   5. The inferior vena cava is normal in size with greater than 50%  respiratory variability, suggesting right atrial pressure of 3 mmHg.   ASSESSMENT & PLAN CHA2DS2-VASc Score = 3  The patient's score is based upon: CHF History: 0 HTN History: 0 Diabetes History: 0 Stroke History: 0 Vascular Disease History: 0 Age Score: 2 Gender Score: 1       ASSESSMENT AND PLAN: Permanent Atrial Fibrillation (ICD10:  I48.11) The patient's CHA2DS2-VASc score is 3, indicating a 3.2% annual risk of stroke.    She is currently in Afib. We discussed diet and swelling of LE which is intermittent. We discussed reduction in sodium and compression stockings. Her HR is well controlled. Continue Toprol  50 mg twice daily.   Secondary Hypercoagulable State (ICD10:  D68.69) The patient is at significant risk for stroke/thromboembolism based upon her CHA2DS2-VASc Score of 3.  Continue Apixaban  (Eliquis ).  Continue Eliquis  5 mg BID without interruption. Dosage is correct based on weight > 60 kg and creatinine < 1.5.     Follow up Afib clinic prn.   Terra Pac, PA-C  [facility] [address] [place], [place]  [address] [phone]

## 2024-05-05 ENCOUNTER — Other Ambulatory Visit: Payer: Self-pay | Admitting: Internal Medicine

## 2024-05-05 DIAGNOSIS — Z1231 Encounter for screening mammogram for malignant neoplasm of breast: Secondary | ICD-10-CM

## 2024-05-24 ENCOUNTER — Encounter: Payer: Self-pay | Admitting: Internal Medicine

## 2024-05-24 NOTE — Progress Notes (Unsigned)
 Subjective:    Patient ID: Yolanda Grant, female    DOB: Jan 12, 1941, 83 y.o.   MRN: [id number]      HPI Yolanda Grant is here for a Physical exam and her chronic medical problems.    BP 92-114 / 63-82      HR 64-87  She is having increased tiredness - she tires easily.    Sleep too much - if she sits down she falls asleep.  If she is busy she will not sleep.    Chronic back pain - from upper thoracic to lower spine - hips.  Worse as day goes on.  Has taken Tylenol  at times and it does help.  Gets out of breath with incline and long distances.     Medications and allergies reviewed with patient and updated if appropriate.  Current Outpatient Medications on File Prior to Visit  Medication Sig Dispense Refill   apixaban  (ELIQUIS ) 5 MG TABS tablet Take 1 tablet (5 mg total) by mouth 2 (two) times daily. 180 tablet 2   Biotin 89999 MCG TABS Take 10,000 mcg by mouth in the morning.     Coenzyme Q10 (CO Q 10 PO) Take 200 mg by mouth every morning.     Cranberry 500 MG CAPS Take 500 mg by mouth every evening.     D-Mannose POWD Take 1 Dose by mouth daily.     famotidine  (PEPCID ) 20 MG tablet Take 1 tablet (20 mg total) by mouth daily.     hydrochlorothiazide  (HYDRODIURIL ) 12.5 MG tablet Take 1 tablet (12.5 mg total) by mouth as needed (for lower extremity swelling.). 90 tablet 3   METAMUCIL FIBER PO Take 1 Scoop by mouth daily as needed (constipation).     metoprolol  succinate (TOPROL -XL) 50 MG 24 hr tablet Take 1 tablet (50 mg total) by mouth 2 (two) times daily. Take with or immediately following a meal. 180 tablet 3   Multiple Minerals-Vitamins (CALCIUM-MAGNESIUM-ZINC-D3) TABS Take 1 tablet by mouth 2 (two) times daily.     Multiple Vitamin (ONE-DAILY MULTI-VITAMIN) TABS Take 1 tablet by mouth daily.     Omega-3 Fatty Acids (OMEGA 3 FISH OIL PO) Take 1 capsule by mouth every morning. Ultimate Omega-1280 mg     Polyethyl Glycol-Propyl Glycol (SYSTANE ULTRA) 0.4-0.3 % SOLN Place 1  drop into both eyes 2 (two) times daily.     pravastatin  (PRAVACHOL ) 20 MG tablet TAKE 1 TABLET BY MOUTH EVERY DAY 90 tablet 2   Probiotic Product (PROBIOTIC PO) Take 1 capsule by mouth daily.     raloxifene  (EVISTA ) 60 MG tablet Take 1 tablet (60 mg total) by mouth daily. 90 tablet 3   No current facility-administered medications on file prior to visit.    Review of Systems  Constitutional:  Positive for fatigue. Negative for fever.  Eyes:  Negative for visual disturbance.  Respiratory:  Positive for shortness of breath (with walking long distances or incline). Negative for cough and wheezing.   Cardiovascular:  Positive for palpitations (occ when sleeping) and leg swelling. Negative for chest pain.  Gastrointestinal:  Positive for constipation (controlled with metamucil). Negative for abdominal pain, blood in stool and diarrhea.       Occ gerd  Genitourinary:  Negative for dysuria.  Musculoskeletal:  Positive for back pain (chronic). Negative for arthralgias.  Skin:  Negative for rash.       Mole scalp  Neurological:  Negative for light-headedness and headaches.  Psychiatric/Behavioral:  Negative for dysphoric mood. The patient  is not nervous/anxious.        Objective:   Vitals:   05/25/24 1301  BP: 106/72  Pulse: 60  Temp: 98 F (36.7 C)  SpO2: 95%   Filed Weights   05/25/24 1301  Weight: 166 lb (75.3 kg)   Body mass index is 28.05 kg/m.  BP Readings from Last 3 Encounters:  05/25/24 106/72  04/06/24 116/76  12/18/23 118/72    Wt Readings from Last 3 Encounters:  05/25/24 166 lb (75.3 kg)  04/06/24 167 lb (75.8 kg)  12/18/23 170 lb (77.1 kg)       Physical Exam Constitutional: She appears well-developed and well-nourished. No distress.  HENT:  Head: Normocephalic and atraumatic.  Right Ear: External ear normal. Normal ear canal and TM Left Ear: External ear normal.  Normal ear canal and TM Mouth/Throat: Oropharynx is clear and moist.  Eyes:  Conjunctivae normal.  Neck: Neck supple. No tracheal deviation present. No thyromegaly present.  No carotid bruit  Cardiovascular: Normal rate, irregular rhythm and normal heart sounds.   No murmur heard.  No edema. Pulmonary/Chest: Effort normal and breath sounds normal. No respiratory distress. She has no wheezes. She has no rales.  Breast: deferred   Abdominal: Soft. She exhibits no distension. There is no tenderness.  Lymphadenopathy: She has no cervical adenopathy.  Skin: Skin is warm and dry. She is not diaphoretic.  Psychiatric: She has a normal mood and affect. Her behavior is normal.     Lab Results  Component Value Date   WBC 4.7 05/13/2023   HGB 14.3 05/13/2023   HCT 44.6 05/13/2023   PLT 196.0 05/13/2023   GLUCOSE 87 05/13/2023   CHOL 180 05/13/2023   TRIG 82.0 05/13/2023   HDL 63.30 05/13/2023   LDLCALC 100 (H) 05/13/2023   Grant 22 05/13/2023   AST 29 05/13/2023   NA 141 05/13/2023   K 4.0 05/13/2023   CL 103 05/13/2023   CREATININE 0.87 05/13/2023   BUN 22 05/13/2023   CO2 29 05/13/2023   TSH 1.75 05/13/2023         Assessment & Plan:   Physical exam: Screening blood work  ordered Exercise  walking a good amount, does you tube videos Weight  is good  Substance abuse  none   Reviewed recommended immunizations.   Health Maintenance  Topic Date Due   COVID-19 Vaccine (4 - 2024-25 season) 06/09/2023   INFLUENZA VACCINE  05/08/2024   DEXA SCAN  06/06/2024   Medicare Annual Wellness (AWV)  11/03/2024   DTaP/Tdap/Td (3 - Td or Tdap) 02/14/2025   Pneumococcal Vaccine: 50+ Years  Completed   Zoster Vaccines- Shingrix  Completed   HPV VACCINES  Aged Out   Meningococcal B Vaccine  Aged Out   Pneumococcal Vaccine  Discontinued          See Problem List for Assessment and Plan of chronic medical problems.

## 2024-05-24 NOTE — Patient Instructions (Addendum)
 Blood work was ordered.       Medications changes include :   decrease your morning dose of metoprolol  to 25 mg daily and continue the 50 mg in the evening.      Return in about 1 year (around 05/25/2025) for Physical Exam.    Health Maintenance, Female Adopting a healthy lifestyle and getting preventive care are important in promoting health and wellness. Ask your health care provider about: The right schedule for you to have regular tests and exams. Things you can do on your own to prevent diseases and keep yourself healthy. What should I know about diet, weight, and exercise? Eat a healthy diet  Eat a diet that includes plenty of vegetables, fruits, low-fat dairy products, and lean protein. Do not eat a lot of foods that are high in solid fats, added sugars, or sodium. Maintain a healthy weight Body mass index (BMI) is used to identify weight problems. It estimates body fat based on height and weight. Your health care provider can help determine your BMI and help you achieve or maintain a healthy weight. Get regular exercise Get regular exercise. This is one of the most important things you can do for your health. Most adults should: Exercise for at least 150 minutes each week. The exercise should increase your heart rate and make you sweat (moderate-intensity exercise). Do strengthening exercises at least twice a week. This is in addition to the moderate-intensity exercise. Spend less time sitting. Even light physical activity can be beneficial. Watch cholesterol and blood lipids Have your blood tested for lipids and cholesterol at 83 years of age, then have this test every 5 years. Have your cholesterol levels checked more often if: Your lipid or cholesterol levels are high. You are older than 83 years of age. You are at high risk for heart disease. What should I know about cancer screening? Depending on your health history and family history, you may need to have  cancer screening at various ages. This may include screening for: Breast cancer. Cervical cancer. Colorectal cancer. Skin cancer. Lung cancer. What should I know about heart disease, diabetes, and high blood pressure? Blood pressure and heart disease High blood pressure causes heart disease and increases the risk of stroke. This is more likely to develop in people who have high blood pressure readings or are overweight. Have your blood pressure checked: Every 3-5 years if you are 72-78 years of age. Every year if you are 54 years old or older. Diabetes Have regular diabetes screenings. This checks your fasting blood sugar level. Have the screening done: Once every three years after age 46 if you are at a normal weight and have a low risk for diabetes. More often and at a younger age if you are overweight or have a high risk for diabetes. What should I know about preventing infection? Hepatitis B If you have a higher risk for hepatitis B, you should be screened for this virus. Talk with your health care provider to find out if you are at risk for hepatitis B infection. Hepatitis C Testing is recommended for: Everyone born from [birth year] through 1965. Anyone with known risk factors for hepatitis C. Sexually transmitted infections (STIs) Get screened for STIs, including gonorrhea and chlamydia, if: You are sexually active and are younger than 83 years of age. You are older than 83 years of age and your health care provider tells you that you are at risk for this type of infection. Your  sexual activity has changed since you were last screened, and you are at increased risk for chlamydia or gonorrhea. Ask your health care provider if you are at risk. Ask your health care provider about whether you are at high risk for HIV. Your health care provider may recommend a prescription medicine to help prevent HIV infection. If you choose to take medicine to prevent HIV, you should first get tested for HIV.  You should then be tested every 3 months for as long as you are taking the medicine. Pregnancy If you are about to stop having your period (premenopausal) and you may become pregnant, seek counseling before you get pregnant. Take 400 to 800 micrograms (mcg) of folic acid every day if you become pregnant. Ask for birth control (contraception) if you want to prevent pregnancy. Osteoporosis and menopause Osteoporosis is a disease in which the bones lose minerals and strength with aging. This can result in bone fractures. If you are 36 years old or older, or if you are at risk for osteoporosis and fractures, ask your health care provider if you should: Be screened for bone loss. Take a calcium or vitamin D  supplement to lower your risk of fractures. Be given hormone replacement therapy (HRT) to treat symptoms of menopause. Follow these instructions at home: Alcohol  use Do not drink alcohol  if: Your health care provider tells you not to drink. You are pregnant, may be pregnant, or are planning to become pregnant. If you drink alcohol : Limit how much you have to: 0-1 drink a day. Know how much alcohol  is in your drink. In the [place] one drink equals one 12 oz bottle of beer (355 mL), one 5 oz glass of wine (148 mL), or one 1 oz glass of hard liquor (44 mL). Lifestyle Do not use any products that contain nicotine or tobacco. These products include cigarettes, chewing tobacco, and vaping devices, such as e-cigarettes. If you need help quitting, ask your health care provider. Do not use street drugs. Do not share needles. Ask your health care provider for help if you need support or information about quitting drugs. General instructions Schedule regular health, dental, and eye exams. Stay current with your vaccines. Tell your health care provider if: You often feel depressed. You have ever been abused or do not feel safe at home. Summary Adopting a healthy lifestyle and getting preventive care  are important in promoting health and wellness. Follow your health care provider's instructions about healthy diet, exercising, and getting tested or screened for diseases. Follow your health care provider's instructions on monitoring your cholesterol and blood pressure. This information is not intended to replace advice given to you by your health care provider. Make sure you discuss any questions you have with your health care provider. Document Revised: 02/13/2021 Document Reviewed: 02/13/2021 Elsevier Patient Education  2024 [employer].

## 2024-05-25 ENCOUNTER — Ambulatory Visit (INDEPENDENT_AMBULATORY_CARE_PROVIDER_SITE_OTHER): Admitting: Internal Medicine

## 2024-05-25 VITALS — BP 106/72 | HR 60 | Temp 98.0°F | Ht 64.5 in | Wt 166.0 lb

## 2024-05-25 DIAGNOSIS — M810 Age-related osteoporosis without current pathological fracture: Secondary | ICD-10-CM

## 2024-05-25 DIAGNOSIS — E782 Mixed hyperlipidemia: Secondary | ICD-10-CM | POA: Diagnosis not present

## 2024-05-25 DIAGNOSIS — I4891 Unspecified atrial fibrillation: Secondary | ICD-10-CM | POA: Diagnosis not present

## 2024-05-25 DIAGNOSIS — R5383 Other fatigue: Secondary | ICD-10-CM | POA: Insufficient documentation

## 2024-05-25 DIAGNOSIS — Z0000 Encounter for general adult medical examination without abnormal findings: Secondary | ICD-10-CM

## 2024-05-25 DIAGNOSIS — R600 Localized edema: Secondary | ICD-10-CM | POA: Diagnosis not present

## 2024-05-25 DIAGNOSIS — Z0001 Encounter for general adult medical examination with abnormal findings: Secondary | ICD-10-CM | POA: Diagnosis not present

## 2024-05-25 DIAGNOSIS — G8929 Other chronic pain: Secondary | ICD-10-CM

## 2024-05-25 DIAGNOSIS — K219 Gastro-esophageal reflux disease without esophagitis: Secondary | ICD-10-CM | POA: Diagnosis not present

## 2024-05-25 DIAGNOSIS — M545 Low back pain, unspecified: Secondary | ICD-10-CM | POA: Insufficient documentation

## 2024-05-25 LAB — LIPID PANEL
Cholesterol: 200 mg/dL (ref 0–200)
HDL: 72.3 mg/dL (ref >39.00)
LDL Cholesterol: 107 mg/dL — ABNORMAL HIGH (ref 0–99)
NonHDL: 127.8
Total CHOL/HDL Ratio: 3
Triglycerides: 103 mg/dL (ref 0.0–149.0)
VLDL: 20.6 mg/dL (ref 0.0–40.0)

## 2024-05-25 LAB — COMPREHENSIVE METABOLIC PANEL WITH GFR
ALT: 17 U/L (ref 0–35)
AST: 23 U/L (ref 0–37)
Albumin: 4.2 g/dL (ref 3.5–5.2)
Alkaline Phosphatase: 65 U/L (ref 39–117)
BUN: 16 mg/dL (ref 6–23)
CO2: 30 meq/L (ref 19–32)
Calcium: 9.7 mg/dL (ref 8.4–10.5)
Chloride: 103 meq/L (ref 96–112)
Creatinine, Ser: 0.95 mg/dL (ref 0.40–1.20)
GFR: 55.39 mL/min — ABNORMAL LOW (ref >60.00)
Glucose, Bld: 83 mg/dL (ref 70–99)
Potassium: 4.2 meq/L (ref 3.5–5.1)
Sodium: 142 meq/L (ref 135–145)
Total Bilirubin: 1.1 mg/dL (ref 0.2–1.2)
Total Protein: 7 g/dL (ref 6.0–8.3)

## 2024-05-25 LAB — CBC WITH DIFFERENTIAL/PLATELET
Basophils Absolute: 0 K/uL (ref 0.0–0.1)
Basophils Relative: 0.4 % (ref 0.0–3.0)
Eosinophils Absolute: 0 K/uL (ref 0.0–0.7)
Eosinophils Relative: 0.5 % (ref 0.0–5.0)
HCT: 43.3 % (ref 36.0–46.0)
Hemoglobin: 14.4 g/dL (ref 12.0–15.0)
Lymphocytes Relative: 35.6 % (ref 12.0–46.0)
Lymphs Abs: 1.9 K/uL (ref 0.7–4.0)
MCHC: 33.2 g/dL (ref 30.0–36.0)
MCV: 94.6 fl (ref 78.0–100.0)
Monocytes Absolute: 0.4 K/uL (ref 0.1–1.0)
Monocytes Relative: 8.4 % (ref 3.0–12.0)
Neutro Abs: 2.9 K/uL (ref 1.4–7.7)
Neutrophils Relative %: 55.1 % (ref 43.0–77.0)
Platelets: 218 K/uL (ref 150.0–400.0)
RBC: 4.58 Mil/uL (ref 3.87–5.11)
RDW: 15 % (ref 11.5–15.5)
WBC: 5.3 K/uL (ref 4.0–10.5)

## 2024-05-25 LAB — VITAMIN D 25 HYDROXY (VIT D DEFICIENCY, FRACTURES): VITD: 92.49 ng/mL (ref 30.00–100.00)

## 2024-05-25 LAB — TSH: TSH: 2.25 u[IU]/mL (ref 0.35–5.50)

## 2024-05-25 MED ORDER — METOPROLOL SUCCINATE ER 50 MG PO TB24: ORAL_TABLET | ORAL | Status: DC

## 2024-05-25 NOTE — Assessment & Plan Note (Signed)
 New Having increased fatigue and tiring easily Could be related to a prodromal fibrillation May also be related to metoprolol  or low BP Heart rate well-controlled right now, SBP in the 90s-low 100s at times Decrease metoprolol  to 25 mg in the morning and 50 mg in evening Monitor BP and heart rate Depending on how this helps or does not help with fatigue and depending on BP, heart rate advised follow-up with cardiology

## 2024-05-25 NOTE — Assessment & Plan Note (Signed)
Chronic GERD controlled Continue pepcid 20 mg daily  

## 2024-05-25 NOTE — Assessment & Plan Note (Addendum)
 Chronic Likely related to chronic venous insufficiency Continue low-sodium diet, continue regular walking elevating legs when sitting during the day Difficulty putting on compression socks Continue HCTZ 12.5 mg daily as needed

## 2024-05-25 NOTE — Assessment & Plan Note (Signed)
 Chronic Likely related to arthritis Gets worse as the day progresses and limits her activity some Tylenol  helps hide and continue Tylenol  as needed If pain gets worse can consider PT or referral to sports medicine

## 2024-05-25 NOTE — Assessment & Plan Note (Addendum)
 Chronic DEXA due-scheduled Continue Evista  60 mg daily- she would like to continue the medication for breast cancer prevention- mom w/ breast ca x 2 Encouraged regular exercise Continue calcium and vitamin D  Check CMP, CBC, vitamin D 

## 2024-05-25 NOTE — Assessment & Plan Note (Addendum)
 Chronic Following with cardiology Continue Eliquis  5 mg twice daily Given fatigue and low SBP try decreasing metoprolol  25 mg in the morning and 50 mg in the evening Monitor BP, heart rate-depending on values and fatigue may need follow-up with cardiology sooner Labs today including CBC, CMP, TSH

## 2024-05-25 NOTE — Assessment & Plan Note (Signed)
Chronic Regular exercise and healthy diet encouraged Check lipid panel, CMP Continue pravastatin 20 mg daily 

## 2024-05-26 ENCOUNTER — Ambulatory Visit: Payer: Self-pay | Admitting: Internal Medicine

## 2024-05-29 ENCOUNTER — Telehealth: Payer: Self-pay | Admitting: Cardiology

## 2024-05-29 MED ORDER — METOPROLOL SUCCINATE ER 25 MG PO TB24: 25.0000 mg | ORAL_TABLET | Freq: Two times a day (BID) | ORAL | Status: DC

## 2024-05-29 MED ORDER — METOPROLOL SUCCINATE ER 25 MG PO TB24: 25.0000 mg | ORAL_TABLET | Freq: Two times a day (BID) | ORAL | 11 refills | Status: DC

## 2024-05-29 NOTE — Telephone Encounter (Signed)
 When she went to her pcp on 8/18- Decreased to 25 mg in morning and 50 mg at night- did it yesterday and this morning.  Yesterday BP was 105/67, last night 104/69 This morning- 92/67 hr 91  5pm- 102/73  Gave this information to Dr Lavona- recommends decreasing dose to 25 mg every morning and 25 mg every night and to keep a daily log.  Called pt back and gave the information above. She verbalized understanding and will give us  an update in 1-2 weeks.

## 2024-05-29 NOTE — Telephone Encounter (Signed)
 Pt c/o medication issue:  1. Name of Medication:   metoprolol  succinate (TOPROL -XL) 50 MG 24 hr tablet   2. How are you currently taking this medication (dosage and times per day)?   As prescribed  3. Are you having a reaction (difficulty breathing--STAT)?   4. What is your medication issue?    Patient stated she has been having no energy and falling asleep a lot and saw her PCP who suggested she reduce this medication to 1/2 tablet in the morning and a whole tablet at night.  Patient stated she has been tracking her BP and it has been reading around 92-64.  Patient wants a call back regarding this medication change.

## 2024-06-10 ENCOUNTER — Ambulatory Visit (HOSPITAL_BASED_OUTPATIENT_CLINIC_OR_DEPARTMENT_OTHER)

## 2024-06-15 ENCOUNTER — Ambulatory Visit
Admission: RE | Admit: 2024-06-15 | Discharge: 2024-06-15 | Disposition: A | Discharge status: Home / Self Care | Source: Ambulatory Visit | Attending: Internal Medicine

## 2024-06-15 DIAGNOSIS — Z1231 Encounter for screening mammogram for malignant neoplasm of breast: Secondary | ICD-10-CM | POA: Diagnosis not present

## 2024-06-18 ENCOUNTER — Ambulatory Visit (HOSPITAL_BASED_OUTPATIENT_CLINIC_OR_DEPARTMENT_OTHER)
Admission: RE | Admit: 2024-06-18 | Discharge: 2024-06-18 | Disposition: A | Discharge status: Home / Self Care | Source: Ambulatory Visit | Attending: Internal Medicine | Admitting: Internal Medicine

## 2024-06-18 DIAGNOSIS — M810 Age-related osteoporosis without current pathological fracture: Secondary | ICD-10-CM | POA: Diagnosis not present

## 2024-06-18 DIAGNOSIS — Z780 Asymptomatic menopausal state: Secondary | ICD-10-CM | POA: Diagnosis not present

## 2024-06-21 ENCOUNTER — Ambulatory Visit: Payer: Self-pay | Admitting: Internal Medicine

## 2024-06-24 ENCOUNTER — Other Ambulatory Visit: Payer: HMO

## 2024-06-26 ENCOUNTER — Telehealth: Payer: Self-pay | Admitting: Cardiology

## 2024-06-26 NOTE — Telephone Encounter (Signed)
 Spoke with the patient who is calling in to report her BP and HR after decreasing metoprolol  to 25 mg twice daily. Patient reports that overall she is feeling well. She states that she does get fatigued easily with exertion. 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 She denies any dizziness. Reports very occasional lightheadedness. She is staying hydrated. Advised patient to continue on current dose of metoprolol  for now and continue to monitor BP and HR and that I will make Dr. Lavona aware for further recommendations.

## 2024-06-26 NOTE — Telephone Encounter (Signed)
 Patient called to report her BP readings:  9/7 117/65 86 9/9 104/71 84 9/10 114/75 74 9/11 107/66 87 9/13 98/71 80 9/15 102/66 80 9/16 96/66 82 9/17 89/67 96 9/18 105/70 81 9/19 99/71 75  She wants to know if she should continue taking the metoprolol  succinate (TOPROL -XL) 25 MG 24 hr tablet, 1 tablet morning and night.

## 2024-06-29 DIAGNOSIS — L820 Inflamed seborrheic keratosis: Secondary | ICD-10-CM | POA: Diagnosis not present

## 2024-06-29 NOTE — Telephone Encounter (Signed)
 Patient is aware that Dr. Lavona does not have any new recommendations and to continue on current dose of metoprolol . Patient verbalized understanding.

## 2024-07-26 ENCOUNTER — Other Ambulatory Visit: Payer: Self-pay | Admitting: Internal Medicine

## 2024-07-27 ENCOUNTER — Telehealth: Payer: Self-pay | Admitting: Cardiology

## 2024-07-27 NOTE — Telephone Encounter (Signed)
 Spoke with pt and she said since 10/11, an app she has on her phone that notes when she has afib has intermittently been telling her she is going in and out of afib. She stated that her HR varies between 40s to 80s to as high as 130s but she is not symptomatic when this happens. During the night, she says her phone will notify her of a heart rhythm alert and that will wake her up initially and she can sometimes feel something is off but during the day, she can't usually tell she is in afib. Metoprolol  dosing was recently changed by Dr. Lavona d/t BP issues. Explained to pt that I will send a message to Dr. Lavona and Fairy Heinrich to review and we will let her know about any recommendations. Sent pt a MyChart message with steps on how to send an EKG strip from her Agricultural consultant through [employer]. Pt verbalized understanding of plan and had no further questions at this time.

## 2024-07-27 NOTE — Telephone Encounter (Signed)
 Pt states that according to Health app on her watch she has had several episodes of Afib since Saturday. Pt states that she does not and has not had any symptoms. She would like a c/b regarding this matter. Please advise

## 2024-07-31 ENCOUNTER — Ambulatory Visit (HOSPITAL_COMMUNITY)
Admission: RE | Admit: 2024-07-31 | Discharge: 2024-07-31 | Disposition: A | Discharge status: Home / Self Care | Source: Ambulatory Visit | Attending: Internal Medicine | Admitting: Internal Medicine

## 2024-07-31 ENCOUNTER — Encounter (HOSPITAL_COMMUNITY): Payer: Self-pay | Admitting: Internal Medicine

## 2024-07-31 VITALS — BP 140/90 | HR 93 | Ht 64.5 in | Wt 169.6 lb

## 2024-07-31 DIAGNOSIS — I4821 Permanent atrial fibrillation: Secondary | ICD-10-CM

## 2024-07-31 DIAGNOSIS — I4719 Other supraventricular tachycardia: Secondary | ICD-10-CM | POA: Diagnosis not present

## 2024-07-31 DIAGNOSIS — D6869 Other thrombophilia: Secondary | ICD-10-CM

## 2024-07-31 NOTE — Progress Notes (Signed)
 Primary Care Physician: Geofm Glade PARAS, MD Primary Cardiologist: Lynwood Schilling, MD Electrophysiologist: None     Referring Physician: Dr. Schilling Yolanda Grant Donette is a 83 y.o. female with a history of paroxysmal SVT, mitral valve prolapse, aortic insufficiency, HLD, and atrial fibrillation who presents for consultation in the [facility]. Patient in permanent Afib / rate control strategy. Patient is on Eliquis  5 mg BID for a CHADS2VASC score of 3.  On evaluation today, she is currently in permanent Afib. She has questions about diet / what is safe to eat while taking Eliquis . She also has questions about intermittent bilateral LE swelling, which she notes to have had before diagnosis of Afib. No missed doses of Eliquis .   On follow up 07/31/24, patient is currently in Afib. Noted visit with Dr. Schilling 06/2023 to continue with rate controlled Afib. Patient notes via Apple watch intermittent Afib alerts that affect her sleep. Metoprolol  recently changed to 25 mg AM 25 mg PM. No missed doses of Eliquis .   Today, she denies symptoms of palpitations, chest pain, shortness of breath, orthopnea, PND, lower extremity edema, dizziness, presyncope, syncope, snoring, daytime somnolence, bleeding, or neurologic sequela. The patient is tolerating medications without difficulties and is otherwise without complaint today.    she has a BMI of Body mass index is 28.66 kg/m.SABRA Filed Weights   07/31/24 1003  Weight: 76.9 kg     Current Outpatient Medications  Medication Sig Dispense Refill   apixaban  (ELIQUIS ) 5 MG TABS tablet Take 1 tablet (5 mg total) by mouth 2 (two) times daily. 180 tablet 2   Biotin 89999 MCG TABS Take 10,000 mcg by mouth in the morning.     Coenzyme Q10 (CO Q 10 PO) Take 200 mg by mouth every morning.     Cranberry 500 MG CAPS Take 500 mg by mouth every evening.     D-Mannose POWD Take 1 Dose by mouth daily.     famotidine  (PEPCID ) 20 MG  tablet Take 1 tablet (20 mg total) by mouth daily.     hydrochlorothiazide  (HYDRODIURIL ) 12.5 MG tablet Take 1 tablet (12.5 mg total) by mouth as needed (for lower extremity swelling.). 90 tablet 3   METAMUCIL FIBER PO Take 1 Scoop by mouth daily as needed (constipation).     metoprolol  succinate (TOPROL -XL) 25 MG 24 hr tablet Take 1 tablet (25 mg total) by mouth in the morning and at bedtime. 60 tablet 11   Multiple Minerals-Vitamins (CALCIUM-MAGNESIUM-ZINC-D3) TABS Take 1 tablet by mouth 2 (two) times daily.     Multiple Vitamin (ONE-DAILY MULTI-VITAMIN) TABS Take 1 tablet by mouth daily.     Omega-3 Fatty Acids (OMEGA 3 FISH OIL PO) Take 1 capsule by mouth every morning. Ultimate Omega-1280 mg     Polyethyl Glycol-Propyl Glycol (SYSTANE ULTRA) 0.4-0.3 % SOLN Place 1 drop into both eyes 2 (two) times daily.     pravastatin  (PRAVACHOL ) 20 MG tablet TAKE 1 TABLET BY MOUTH EVERY DAY 90 tablet 2   Probiotic Product (PROBIOTIC PO) Take 1 capsule by mouth daily.     raloxifene  (EVISTA ) 60 MG tablet TAKE 1 TABLET BY MOUTH EVERY DAY 90 tablet 3   No current facility-administered medications for this encounter.    Atrial Fibrillation Management history:  Previous antiarrhythmic drugs: none Previous cardioversions: 06/12/23 Previous ablations: none Anticoagulation history: Eliquis    ROS- All systems are reviewed and negative except as per the HPI above.  Physical Exam:  BP (!) 140/90   Pulse 93   Ht 5' 4.5 (1.638 m)   Wt 76.9 kg   BMI 28.66 kg/m   GEN- The patient is well appearing, alert and oriented x 3 today.   Neck - no JVD or carotid bruit noted Lungs- Clear to ausculation bilaterally, normal work of breathing Heart- Irregular rate and rhythm, no murmurs, rubs or gallops, PMI not laterally displaced Extremities- no clubbing, cyanosis, or edema Skin - no rash or ecchymosis noted   EKG today demonstrates  Vent. rate 93 BPM PR interval * ms QRS duration 74 ms QT/QTcB 350/435  ms P-R-T axes * -59 46 Atrial fibrillation Left axis deviation Low voltage QRS Cannot rule out Anteroseptal infarct (cited on or before 06-Apr-2024) Abnormal ECG When compared with ECG of 06-Apr-2024 09:59, No significant change was found  Echo 11/30/22 demonstrated   1. Left ventricular ejection fraction, by estimation, is 60 to 65%. The  left ventricle has normal function. The left ventricle has no regional  wall motion abnormalities. There is mild concentric left ventricular  hypertrophy. Left ventricular diastolic  parameters were normal.   2. Right ventricular systolic function is normal. The right ventricular  size is normal. There is normal pulmonary artery systolic pressure.   3. The mitral valve is normal in structure. Trivial mitral valve  regurgitation. No evidence of mitral stenosis. Significant prolapse not  seen in this study.   4. The aortic valve was not well visualized. Aortic valve regurgitation  is mild to moderate. No aortic stenosis is present.   5. The inferior vena cava is normal in size with greater than 50%  respiratory variability, suggesting right atrial pressure of 3 mmHg.   CHA2DS2-VASc Score = 3  The patient's score is based upon: CHF History: 0 HTN History: 0 Diabetes History: 0 Stroke History: 0 Vascular Disease History: 0 Age Score: 2 Gender Score: 1      ASSESSMENT AND PLAN: Permanent Atrial Fibrillation (ICD10:  I48.11) The patient's CHA2DS2-VASc score is 3, indicating a 3.2% annual risk of stroke.    Patient is currently in Afib. We discussed rate control strategy again and patient does admit she feels well when in Afib and cannot notice it. She can do all her daily activities without issue. We turned off her notifications on her Apple watch to prevent pings at night/during the day. She notes to at times feel heart palpitations since lowering metoprolol  but overall okay. Advised to monitor BP since she lowered metoprolol . Continue metoprolol   25 mg BID.  Secondary Hypercoagulable State (ICD10:  D68.69) The patient is at significant risk for stroke/thromboembolism based upon her CHA2DS2-VASc Score of 3.  Continue Apixaban  (Eliquis ).  Continue Eliquis  5 mg BID.     Follow up Afib clinic prn.   Terra Pac, PA-C  Afib Clinic [facility] [address] [place], [place] [address] [phone]

## 2024-08-13 ENCOUNTER — Other Ambulatory Visit: Payer: Self-pay | Admitting: Cardiology

## 2024-09-08 ENCOUNTER — Other Ambulatory Visit: Payer: Self-pay | Admitting: Cardiology

## 2024-09-08 DIAGNOSIS — I4891 Unspecified atrial fibrillation: Secondary | ICD-10-CM

## 2024-09-08 MED ORDER — APIXABAN 5 MG PO TABS: 5.0000 mg | ORAL_TABLET | Freq: Two times a day (BID) | ORAL | 2 refills | Status: AC

## 2024-09-08 NOTE — Telephone Encounter (Signed)
 Eliquis  5mg  refill request received. Patient is 83 years old, weight-76.9kg, Crea-0.95 on 05/25/24, Diagnosis-Afib, and last seen by Fairy Heinrich on 07/31/24. Dose is appropriate based on dosing criteria. Will send in refill to requested pharmacy.

## 2024-09-23 ENCOUNTER — Ambulatory Visit (INDEPENDENT_AMBULATORY_CARE_PROVIDER_SITE_OTHER): Admitting: Radiology

## 2024-09-23 ENCOUNTER — Ambulatory Visit
Admission: EM | Admit: 2024-09-23 | Discharge: 2024-09-23 | Disposition: A | Discharge status: Home / Self Care | Attending: Nurse Practitioner | Admitting: Nurse Practitioner

## 2024-09-23 ENCOUNTER — Other Ambulatory Visit: Payer: Self-pay

## 2024-09-23 ENCOUNTER — Telehealth: Payer: Self-pay

## 2024-09-23 DIAGNOSIS — J209 Acute bronchitis, unspecified: Secondary | ICD-10-CM

## 2024-09-23 DIAGNOSIS — R059 Cough, unspecified: Secondary | ICD-10-CM | POA: Diagnosis not present

## 2024-09-23 MED ORDER — IPRATROPIUM-ALBUTEROL 0.5-2.5 (3) MG/3ML IN SOLN
3.0000 mL | Freq: Once | RESPIRATORY_TRACT | Status: AC
  Administered 2024-09-23: 10:00:00 3 mL via RESPIRATORY_TRACT

## 2024-09-23 MED ORDER — AZITHROMYCIN 250 MG PO TABS: 250.0000 mg | ORAL_TABLET | Freq: Every day | ORAL | 0 refills | Status: DC

## 2024-09-23 MED ORDER — MUCINEX DM MAXIMUM STRENGTH 60-1200 MG PO TB12: 1.0000 | ORAL_TABLET | Freq: Two times a day (BID) | ORAL | 0 refills | Status: DC

## 2024-09-23 MED ORDER — HYDROCODONE BIT-HOMATROP MBR 5-1.5 MG/5ML PO SOLN: 5.0000 mL | Freq: Three times a day (TID) | ORAL | 0 refills | Status: AC | PRN

## 2024-09-23 MED ORDER — DEXAMETHASONE SOD PHOSPHATE PF 10 MG/ML IJ SOLN
10.0000 mg | Freq: Once | INTRAMUSCULAR | Status: AC
  Administered 2024-09-23: 10:00:00 10 mg via INTRAMUSCULAR

## 2024-09-23 NOTE — ED Provider Notes (Signed)
 [facility]    CSN: [id number] Arrival date & time: 09/23/24  0919      History   Chief Complaint Chief Complaint  Patient presents with   Cough   Fever    101 F temp noted this morning upon wakening. Face felt very flushed. This was a tympanic and temporal measurement.     HPI Yolanda Grant is a 83 y.o. female.   Discussed the use of AI scribe software for clinical note transcription with the patient, who gave verbal consent to proceed.   The patient is a female with a history of atrial fibrillation on chronic anticoagulation who presents with a cough that has been ongoing for approximately one month. The cough is associated with chest tightness, shortness of breath, nausea, and sore throat, which she attributes to frequent coughing. She reports that her current symptoms feel very similar to a prior episode of pneumonia.  The shortness of breath occurs primarily with walking. She notes that aging may be contributing to this symptom but feels it has been more noticeable during this illness. She describes episodes of persistent coughing fits during which she coughs continuously until she feels nauseated and as though she may vomit, with associated chest tightness. She reports intermittent days of improvement when the cough nearly resolves, followed by recurrence.  She also reports a mild headache, which is unusual for her and which she attributes to coughing and congestion. She states her nose feels congested but is not actively running, and she has occasional sneezing without significant rhinorrhea. This morning, she awoke feeling hot with flushed, congested cheeks and recorded a temperature of 101F. She denies prior fevers, chills, or body aches before this morning.  For symptom management, she initially took hydrocodone -containing cough syrup with partial relief but had a limited supply. She has also tried Alka-Seltzer. She denies known sick contacts prior to symptom  onset. She denies wheezing, runny nose, sinus pressure, or ear-related symptoms. She denies a history of diabetes.  The following sections of the patient's history were reviewed and updated as appropriate: allergies, current medications, past family history, past medical history, past social history, past surgical history, and problem list.        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 02/13/2021   Hammer toe of second toe of left foot 02/13/2021   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   Aortic insufficiency 11/10/2020   H/O erythema nodosum 11/10/2020   Microscopic hematuria, chronic, asymptomatic 11/10/2020   History of UTI 11/10/2020   Paroxysmal atrial tachycardia 11/07/2020   GERD (gastroesophageal reflux disease) 11/07/2020   Arthritis 11/07/2020   Hyperlipidemia 11/06/2020   Osteoporosis 11/06/2020    Past Surgical History:  Procedure Laterality Date   ABDOMINAL HYSTERECTOMY     anemia, heavy bleeding   AUGMENTATION MAMMAPLASTY     BREAST BIOPSY     BREAST RECONSTRUCTION WITH PLACEMENT OF TISSUE EXPANDER AND ALLODERM     CARDIOVERSION N/A 06/12/2023   Procedure:  CARDIOVERSION;  Surgeon: Loni Soyla LABOR, MD;  Location: [facility] INVASIVE CV LAB;  Service: Cardiovascular;  Laterality: N/A;   CATARACT EXTRACTION Left 12/28/2021   CATARACT EXTRACTION Right 02/13/2022   TUBAL LIGATION      OB History   No obstetric history on file.      Home Medications    Prior to Admission  medications  Medication Sig Start Date End Date Taking? Authorizing Provider  azithromycin  (ZITHROMAX ) 250 MG tablet Take 1 tablet (250 mg total) by mouth daily. Take first 2 tablets together, then 1 every day until finished. 09/23/24  Yes Iola Lukes, FNP  Dextromethorphan-guaiFENesin  (MUCINEX  DM MAXIMUM STRENGTH) 60-1200 MG TB12 Take 1 tablet by mouth 2 (two) times daily. 09/23/24  Yes Iola Lukes, FNP  HYDROcodone  bit-homatropine (HYCODAN) 5-1.5 MG/5ML syrup Take 5 mLs by mouth every 8 (eight) hours as needed for cough. 09/23/24  Yes Iola Lukes, FNP  apixaban  (ELIQUIS ) 5 MG TABS tablet Take 1 tablet (5 mg total) by mouth 2 (two) times daily. 09/08/24   Lynwood Schilling, MD  Biotin 89999 MCG TABS Take 10,000 mcg by mouth in the morning.    [provider]  Coenzyme Q10 (CO Q 10 PO) Take 200 mg by mouth every morning.    [provider]  Cranberry 500 MG CAPS Take 500 mg by mouth every evening.    [provider]  D-Mannose POWD Take 1 Dose by mouth daily.    [provider]  famotidine  (PEPCID ) 20 MG tablet Take 1 tablet (20 mg total) by mouth daily. 05/10/22   Geofm Glade PARAS, MD  hydrochlorothiazide  (HYDRODIURIL ) 12.5 MG tablet Take 1 tablet (12.5 mg total) by mouth as needed (for lower extremity swelling.). 03/20/24   Lynwood Schilling, MD  METAMUCIL FIBER PO Take 1 Scoop by mouth daily as needed (constipation).    [provider]  metoprolol  succinate (TOPROL -XL) 25 MG 24 hr tablet Take 1 tablet (25 mg total) by mouth in the morning and at bedtime. 05/29/24   Lynwood Schilling, MD  Multiple Minerals-Vitamins (CALCIUM-MAGNESIUM-ZINC-D3) TABS Take 1 tablet by mouth 2 (two) times daily.    [provider]  Multiple Vitamin (ONE-DAILY MULTI-VITAMIN) TABS Take 1 tablet by mouth daily.    [provider]  Omega-3 Fatty Acids (OMEGA 3 FISH OIL PO) Take 1 capsule by mouth every morning. Ultimate Omega-1280 mg    [provider]  Polyethyl Glycol-Propyl Glycol (SYSTANE ULTRA) 0.4-0.3 % SOLN Place 1 drop into both eyes 2 (two) times daily.    [provider]  pravastatin  (PRAVACHOL ) 20 MG tablet TAKE 1 TABLET BY MOUTH EVERY DAY 03/31/24   Lynwood Schilling, MD  Probiotic Product (PROBIOTIC PO) Take 1 capsule by mouth daily.    [provider]  raloxifene  (EVISTA ) 60 MG tablet TAKE 1 TABLET BY MOUTH EVERY DAY 07/27/24   Geofm Glade PARAS, MD    Family History Family History  Problem Relation Age of Onset   Breast cancer Mother     Social History Social History[1]   Allergies   Sulfa antibiotics and Augmentin [amoxicillin-pot clavulanate]   Review of Systems Review of Systems  Constitutional:  Positive for fever (none until this morning, TMAX 101). Negative for chills.  HENT:  Positive for congestion, sneezing (occasionally) and sore throat. Negative for ear pain and rhinorrhea.   Respiratory:  Positive for cough, chest tightness and shortness of breath. Negative for wheezing.   Gastrointestinal:  Positive for nausea. Negative for diarrhea and vomiting.  Musculoskeletal:  Negative for myalgias.  Neurological:  Positive for headaches (mild).  All other systems reviewed and are negative.    Physical Exam Triage Vital Signs ED Triage Vitals  Encounter Vitals Group     BP 09/23/24 0944 131/82     Girls Systolic BP Percentile --      Girls Diastolic BP Percentile --      Boys Systolic BP Percentile --      Boys Diastolic BP Percentile --      Pulse Rate 09/23/24 0944 97     Resp 09/23/24 0944 18     Temp 09/23/24 0944 98.3 F (36.8 C)     Temp Source 09/23/24 0944 Oral     SpO2 09/23/24 0944 98 %     Weight 09/23/24 0943 165 lb (74.8 kg)     Height 09/23/24 0943 5' 4.5 (1.638 m)     Head Circumference --      Peak Flow --      Pain Score 09/23/24 0942 4     Pain Loc --      Pain Education --      Exclude from Growth Chart --    No data found.  Updated Vital  Signs BP 131/82 (BP Location: Right Arm)   Pulse 97   Temp 98.3 F (36.8 C) (Oral)   Resp 18   Ht 5' 4.5 (1.638 m)   Wt 165 lb (74.8 kg)   SpO2 98%   BMI 27.88 kg/m   Visual Acuity Right Eye Distance:   Left Eye Distance:   Bilateral Distance:    Right Eye Near:   Left Eye Near:    Bilateral Near:     Physical Exam Vitals reviewed.  Constitutional:      General: She is awake. She is not in acute distress.    Appearance: Normal appearance. She is well-developed. She is not ill-appearing, toxic-appearing or diaphoretic.  HENT:     Head: Normocephalic.     Right Ear: Hearing, tympanic membrane, ear canal and external ear normal. No drainage, swelling or tenderness. No middle ear effusion. Tympanic membrane is not erythematous.     Left Ear: Hearing, tympanic membrane, ear canal and external ear normal. No drainage, swelling or tenderness.  No middle ear effusion. Tympanic membrane is not erythematous.     Nose: Congestion present.     Mouth/Throat:     Lips: Pink.     Mouth: Mucous membranes are moist.     Pharynx: Oropharynx is clear. Uvula midline. No pharyngeal swelling, oropharyngeal exudate, posterior oropharyngeal erythema or uvula swelling.     Tonsils: No tonsillar exudate or tonsillar abscesses.  Eyes:     General: Vision grossly intact.     Conjunctiva/sclera: Conjunctivae normal.  Cardiovascular:     Rate and Rhythm: Normal rate. Rhythm irregular.     Pulses: Normal pulses.     Heart sounds: Normal heart sounds.  Pulmonary:     Effort: Pulmonary effort is normal. No tachypnea or respiratory distress.     Breath sounds: Normal breath sounds and air entry. No decreased air movement. No decreased breath sounds, wheezing or rhonchi.     Comments: Respirations even and unlabored  Abdominal:     Palpations: Abdomen is soft.  Musculoskeletal:        General: Normal range of motion.     Cervical back: Full passive range of motion without pain, normal range of  motion and neck supple.     Right  lower leg: No edema.     Left lower leg: No edema.  Lymphadenopathy:     Cervical: No cervical adenopathy.  Skin:    General: Skin is warm and dry.  Neurological:     General: No focal deficit present.     Mental Status: She is alert and oriented to person, place, and time.  Psychiatric:        Behavior: Behavior is cooperative.      UC Treatments / Results  Labs (all labs ordered are listed, but only abnormal results are displayed) Labs Reviewed - No data to display  EKG   Radiology DG Chest 2 View Result Date: 09/23/2024 CLINICAL DATA:  Cough and shortness of breath for weeks. EXAM: CHEST - 2 VIEW COMPARISON:  08/08/2021 FINDINGS: Lungs are adequately inflated and otherwise clear. Mild stable cardiomegaly. Remainder of the exam is unchanged. IMPRESSION: 1. No acute cardiopulmonary disease. 2. Mild stable cardiomegaly. Electronically Signed   By: Toribio Agreste M.D.   On: 09/23/2024 10:27    Procedures Procedures (including critical care time)  Medications Ordered in UC Medications  ipratropium-albuterol  (DUONEB) 0.5-2.5 (3) MG/3ML nebulizer solution 3 mL (3 mLs Nebulization Given 09/23/24 1026)  dexamethasone  (DECADRON ) injection 10 mg (10 mg Intramuscular Given 09/23/24 1024)    Initial Impression / Assessment and Plan / UC Course  I have reviewed the triage vital signs and the nursing notes.  Pertinent labs & imaging results that were available during my care of the patient were reviewed by me and considered in my medical decision making (see chart for details).     The patient is a female with a history of atrial fibrillation on chronic anticoagulation presenting with a persistent cough for approximately one month, accompanied by chest tightness, exertional shortness of breath, nausea, sore throat, and a new fever reported this morning. Given the prolonged duration of symptoms and the patients report that the illness feels similar  to a prior episode of pneumonia, further evaluation was warranted despite a benign physical examination. The patient is afebrile in clinic, nontoxic in appearance, with normal vital signs including oxygen saturation and blood pressure, and clear lung sounds bilaterally.  A chest X-ray was obtained to evaluate for pneumonia or other pulmonary pathology and showed mild, stable cardiomegaly without evidence of acute cardiopulmonary disease. The overall presentation is most consistent with subacute bronchitis with associated airway inflammation and postinfectious cough, with low suspicion for acute pneumonia at this time.  Treatment was directed toward reducing airway inflammation and improving symptom control. The patient received an in-office nebulized breathing treatment to assess for improvement in chest tightness and comfort, as well as an intramuscular dexamethasone  injection. Given the prolonged cough, recent fever, and symptom severity, she will be treated with azithromycin , Mucinex  DM twice daily, and Hycodan as needed for cough, which she has previously tolerated and found effective.  The patient was advised to follow up with her primary care provider if symptoms do not improve over the next several days or if the cough persists beyond treatment. She was instructed to seek emergency care for worsening shortness of breath, chest pain, new or persistent fever, hemoptysis, dizziness or syncope, oxygen saturation decline, or any acute clinical deterioration, especially given her cardiac history and chronic anticoagulation.  Today's evaluation has revealed no signs of a dangerous process. Discussed diagnosis with patient and/or guardian. Patient and/or guardian aware of their diagnosis, possible red flag symptoms to watch out for and need for close follow up. Patient and/or guardian understands  verbal and written discharge instructions. Patient and/or guardian comfortable with plan and disposition.  Patient  and/or guardian has a clear mental status at this time, good insight into illness (after discussion and teaching) and has clear judgment to make decisions regarding their care  Documentation was completed with the aid of voice recognition software. Transcription may contain typographical errors.  Final Clinical Impressions(s) / UC Diagnoses   Final diagnoses:  Cough, unspecified type  Subacute bronchitis     Discharge Instructions      You were seen today for a cough that has been ongoing for about one month, along with chest tightness, shortness of breath with activity, and a recent fever. Your exam and vital signs were reassuring, and your chest X-ray did not show pneumonia or any acute lung problems. Based on your symptoms and testing, this illness is most consistent with bronchitis and airway inflammation, which can cause a lingering cough even after an infection.  You were treated today with a breathing treatment and a steroid injection to help reduce inflammation and improve breathing comfort. You have been prescribed azithromycin  to help treat a possible bacterial component, Mucinex  DM twice daily to loosen mucus and reduce coughing, and Hycodan as needed for cough, which may cause drowsiness, so avoid driving or alcohol  while taking it. Drink plenty of fluids, get extra rest, and consider using a humidifier to help soothe your airways. Warm fluids and honey may also help calm coughing.  Please follow up with your primary care provider if your symptoms are not improving within several days, if the cough continues after completing your medications, or if you develop ongoing shortness of breath with normal activities. Seek emergency care right away if you develop worsening or sudden shortness of breath, chest pain, coughing up blood, high or persistent fever, dizziness or fainting, confusion, blue or gray discoloration of the lips or fingertips, or any sudden or concerning change in your  condition.     ED Prescriptions     Medication Sig Dispense Auth. Provider   Dextromethorphan-guaiFENesin  (MUCINEX  DM MAXIMUM STRENGTH) 60-1200 MG TB12 Take 1 tablet by mouth 2 (two) times daily. 20 tablet Iola Lukes, FNP   azithromycin  (ZITHROMAX ) 250 MG tablet Take 1 tablet (250 mg total) by mouth daily. Take first 2 tablets together, then 1 every day until finished. 6 tablet Iola Lukes, FNP   HYDROcodone  bit-homatropine (HYCODAN) 5-1.5 MG/5ML syrup Take 5 mLs by mouth every 8 (eight) hours as needed for cough. 120 mL Iola Lukes, FNP      I have reviewed the PDMP during this encounter.     [1]  Social History Tobacco Use   Smoking status: Never   Smokeless tobacco: Never   Tobacco comments:    Never smoked 07/31/24  Vaping Use   Vaping status: Never Used  Substance Use Topics   Alcohol  use: Not Currently   Drug use: Never     Iola Lukes, FNP 09/23/24 1045

## 2024-09-23 NOTE — Telephone Encounter (Signed)
 Patient seen at [facility] location today and treated.

## 2024-09-23 NOTE — Discharge Instructions (Addendum)
 You were seen today for a cough that has been ongoing for about one month, along with chest tightness, shortness of breath with activity, and a recent fever. Your exam and vital signs were reassuring, and your chest X-ray did not show pneumonia or any acute lung problems. Based on your symptoms and testing, this illness is most consistent with bronchitis and airway inflammation, which can cause a lingering cough even after an infection.  You were treated today with a breathing treatment and a steroid injection to help reduce inflammation and improve breathing comfort. You have been prescribed azithromycin  to help treat a possible bacterial component, Mucinex  DM twice daily to loosen mucus and reduce coughing, and Hycodan as needed for cough, which may cause drowsiness, so avoid driving or alcohol  while taking it. Drink plenty of fluids, get extra rest, and consider using a humidifier to help soothe your airways. Warm fluids and honey may also help calm coughing.  Please follow up with your primary care provider if your symptoms are not improving within several days, if the cough continues after completing your medications, or if you develop ongoing shortness of breath with normal activities. Seek emergency care right away if you develop worsening or sudden shortness of breath, chest pain, coughing up blood, high or persistent fever, dizziness or fainting, confusion, blue or gray discoloration of the lips or fingertips, or any sudden or concerning change in your condition.

## 2024-09-23 NOTE — Telephone Encounter (Signed)
 Copied from CRM #[id number]. Topic: Clinical - Medical Advice >> Sep 23, 2024  8:42 AM Robinson H wrote: Reason for CRM: Patient has had a cough on and off since Thanksgiving, states she'll go a few days without coughing then it comes back. Agent offered to schedule patient but nothing available until Friday 12/19 and patient is asking what should she do.  Margel [phone] >> Sep 23, 2024  8:47 AM Robinson H wrote: Patient decided to schedule an appointment just in case but still wants a call back

## 2024-09-23 NOTE — ED Triage Notes (Signed)
 Pt presents with a chief complaint of productive cough x 4 weeks. States she is experiencing chest tightness, SOB, nausea, and a sore throat. Did take leftover hydrocodone  cough syrup at the onset of symptoms. Seemed to be helpful but only had enough for two doses. Also took one dose of Alka-seltzer recently. States the last time she experienced these sx, she was dx with pneumonia.

## 2024-09-25 ENCOUNTER — Ambulatory Visit: Admitting: Internal Medicine

## 2024-10-05 ENCOUNTER — Ambulatory Visit: Admitting: Family Medicine

## 2024-10-06 ENCOUNTER — Ambulatory Visit: Admitting: Family Medicine

## 2024-10-06 ENCOUNTER — Ambulatory Visit (INDEPENDENT_AMBULATORY_CARE_PROVIDER_SITE_OTHER)

## 2024-10-06 ENCOUNTER — Ambulatory Visit: Payer: Self-pay | Admitting: Family Medicine

## 2024-10-06 ENCOUNTER — Encounter: Payer: Self-pay | Admitting: Family Medicine

## 2024-10-06 VITALS — BP 128/80 | Ht 64.5 in | Wt 163.0 lb

## 2024-10-06 DIAGNOSIS — B9689 Other specified bacterial agents as the cause of diseases classified elsewhere: Secondary | ICD-10-CM

## 2024-10-06 DIAGNOSIS — J069 Acute upper respiratory infection, unspecified: Secondary | ICD-10-CM

## 2024-10-06 DIAGNOSIS — I4811 Longstanding persistent atrial fibrillation: Secondary | ICD-10-CM

## 2024-10-06 DIAGNOSIS — R052 Subacute cough: Secondary | ICD-10-CM | POA: Diagnosis not present

## 2024-10-06 DIAGNOSIS — R0602 Shortness of breath: Secondary | ICD-10-CM

## 2024-10-06 LAB — POCT INFLUENZA A/B
Influenza A, POC: NEGATIVE
Influenza B, POC: NEGATIVE

## 2024-10-06 LAB — POC COVID19 BINAXNOW: SARS Coronavirus 2 Ag: NEGATIVE

## 2024-10-06 MED ORDER — PREDNISONE 20 MG PO TABS: 40.0000 mg | ORAL_TABLET | Freq: Every day | ORAL | 0 refills | Status: AC

## 2024-10-06 MED ORDER — SPACER/AERO-HOLDING CHAMBERS DEVI: 1.0000 | 0 refills | Status: AC

## 2024-10-06 MED ORDER — DOXYCYCLINE HYCLATE 100 MG PO CAPS: 100.0000 mg | ORAL_CAPSULE | Freq: Two times a day (BID) | ORAL | 0 refills | Status: AC

## 2024-10-06 MED ORDER — ALBUTEROL SULFATE HFA 108 (90 BASE) MCG/ACT IN AERS: 2.0000 | INHALATION_SPRAY | Freq: Four times a day (QID) | RESPIRATORY_TRACT | 0 refills | Status: AC | PRN

## 2024-10-06 NOTE — Progress Notes (Signed)
 "  Acute Office Visit  Subjective:     Patient ID: Yolanda Grant, female    DOB: 05-16-1941, 83 y.o.   MRN: [id number]  Chief Complaint  Patient presents with   Cough    Ongoing since Thanksgiving. Was seen at [facility] on 12/17, given antibiotic and cough syrup. Cough is unchanged since then. Not coughing at night as much, but feels congestion in chest, not producing any mucus. Has SOB and occasional wheezing. Using mucinex  OTC. Would like to be swabbed for Covid & Flu    HPI  Discussed the use of AI scribe software for clinical note transcription with the patient, who gave verbal consent to proceed.  History of Present Illness Yolanda Grant is an 83 year old female with atrial fibrillation and supraventricular tachycardia who presents with a persistent cough.  Cough - Sudden onset of persistent cough beginning on Thanksgiving night - Initially almost nonstop, now improved but still present - Temporary relief with breathing treatment at urgent care - Partial relief with over-the-counter medication - No use of inhaler at home  Nasal congestion and upper respiratory symptoms - Nasal congestion began the Sunday before this visit - No fever - Crackling sensation heard when lying down at night, perceived in the head  Recent medical interventions - Seen at urgent care on December 17 - Chest x-ray was clear - Received Z-Pak, cough syrup, steroid injection, and breathing treatment  Cardiac arrhythmias - Atrial fibrillation and supraventricular tachycardia - Phone monitoring shows atrial fibrillation almost continuously  Medication allergies - Allergy to sulfa (throat and mouth blisters) - Allergy to Augmentin (gastrointestinal distress) - Uncertain about penicillin reactions - Tolerates doxycycline      ROS Per HPI      Objective:    BP 128/80 (BP Location: Left Arm, Patient Position: Sitting)   Ht 5' 4.5 (1.638 m)   Wt 163 lb (73.9 kg)   SpO2 98%   BMI 27.55  kg/m    Physical Exam Vitals and nursing note reviewed.  Constitutional:      General: She is not in acute distress.    Appearance: She is normal weight. She is ill-appearing.  HENT:     Head: Normocephalic and atraumatic.     Right Ear: External ear normal.     Left Ear: External ear normal.     Nose: Congestion present.     Mouth/Throat:     Mouth: Mucous membranes are moist.     Comments: Oropharyngeal cobblestoning   Eyes:     Extraocular Movements: Extraocular movements intact.     Pupils: Pupils are equal, round, and reactive to light.  Cardiovascular:     Rate and Rhythm: Normal rate and regular rhythm.     Pulses: Normal pulses.     Heart sounds: Normal heart sounds.  Pulmonary:     Effort: Pulmonary effort is normal. No respiratory distress.     Breath sounds: Wheezing (mild to bilateral bases) present. No rhonchi or rales.     Comments: Dry cough  Musculoskeletal:        General: Normal range of motion.     Cervical back: Normal range of motion.     Right lower leg: No edema.     Left lower leg: No edema.  Lymphadenopathy:     Cervical: Cervical adenopathy present.  Neurological:     General: No focal deficit present.     Mental Status: She is alert and oriented to person, place, and time.  Psychiatric:  Mood and Affect: Mood normal.        Thought Content: Thought content normal.     Results for orders placed or performed in visit on 10/06/24  POC COVID-19 BinaxNow  Result Value Ref Range   SARS Coronavirus 2 Ag Negative Negative  POCT Influenza A/B  Result Value Ref Range   Influenza A, POC Negative Negative   Influenza B, POC Negative Negative        Assessment & Plan:   Assessment and Plan Assessment & Plan Bacterial upper respiratory infection with subacute cough and shortness of breath Persistent cough and shortness of breath suggest possible progression to pneumonia. Previous treatments were ineffective. Breathing treatment  provided temporary relief. - Ordered chest x-ray to evaluate for pneumonia. - Prescribed doxycycline  for potential bacterial infection. - Prescribed albuterol  inhaler and spacer for breathing difficulties. - Prescribed oral steroids for five days, with caution due to potential heart rate increase. - Provided instructions for inhaler use with spacer.  Longstanding persistent Atrial fibrillation Intermittent atrial fibrillation exacerbated by illness. Current heart rate is 78 bpm. - Monitor heart rate and symptoms with inhaler use.     Orders Placed This Encounter  Procedures   DG Chest 2 View    Standing Status:   Future    Number of Occurrences:   1    Expiration Date:   04/06/2025    Reason for Exam (SYMPTOM  OR DIAGNOSIS REQUIRED):   Cough, sob    Preferred imaging location?:   [facility]   POC COVID-19 BinaxNow   POCT Influenza A/B     Meds ordered this encounter  Medications   doxycycline  (VIBRAMYCIN ) 100 MG capsule    Sig: Take 1 capsule (100 mg total) by mouth 2 (two) times daily for 7 days.    Dispense:  14 capsule    Refill:  0   predniSONE (DELTASONE) 20 MG tablet    Sig: Take 2 tablets (40 mg total) by mouth daily for 5 days.    Dispense:  10 tablet    Refill:  0   albuterol  (VENTOLIN  HFA) 108 (90 Base) MCG/ACT inhaler    Sig: Inhale 2 puffs into the lungs every 6 (six) hours as needed for wheezing or shortness of breath.    Dispense:  8 g    Refill:  0   Spacer/Aero-Holding Chambers DEVI    Sig: 1 Device by Does not apply route every 4 (four) hours.    Dispense:  1 each    Refill:  0    Return if symptoms worsen or fail to improve.  Corean LITTIE Ku, FNP  "

## 2024-10-06 NOTE — Patient Instructions (Signed)
 I have sent in a prescription for doxycycline  for you to take twice a day for 7 days.  Take this medication with food as it can upset your stomach if you do not.  I have sent in prednisone for you to take 2 tablets once daily in the morning with breakfast for the next 5 days.  I have sent in an albuterol  inhaler for you to use 2 puffs every 4 hours as needed for wheezing.  We are getting an xray today. We will be in contact with any abnormal results that require further attention.  Follow-up with me for new or worsening symptoms.

## 2024-10-13 ENCOUNTER — Telehealth: Payer: Self-pay | Admitting: Cardiology

## 2024-10-13 NOTE — Telephone Encounter (Signed)
 Left message for pt to call.

## 2024-10-13 NOTE — Telephone Encounter (Signed)
 STAT if HR is under 50 or over 120 (normal HR is 60-100 beats per minute)  What is your heart rate?   156 at 10:30 am this morning  Do you have a log of your heart rate readings (document readings)?   No  Do you have any other symptoms?  No  Patient is concerned her HR has been trending high.  Patient noted her HR at 12 midnight it was 155.  Patient noted she had been taking Prednisone , Doxycycline  and Albuterol  for bronchitis for the last 5 days and noted she has now stopped taking these medications.

## 2024-10-14 NOTE — Telephone Encounter (Signed)
"  Left message to call back.   "

## 2024-10-14 NOTE — Telephone Encounter (Signed)
 Patient returned RN's call and stated her HR is still showing 154.

## 2024-10-15 ENCOUNTER — Ambulatory Visit (HOSPITAL_COMMUNITY)
Admission: RE | Admit: 2024-10-15 | Discharge: 2024-10-15 | Disposition: A | Discharge status: Home / Self Care | Source: Ambulatory Visit | Attending: Internal Medicine | Admitting: Internal Medicine

## 2024-10-15 ENCOUNTER — Encounter (HOSPITAL_COMMUNITY): Payer: Self-pay | Admitting: Internal Medicine

## 2024-10-15 VITALS — BP 132/98 | HR 132 | Ht 64.5 in | Wt 168.7 lb

## 2024-10-15 DIAGNOSIS — D6869 Other thrombophilia: Secondary | ICD-10-CM

## 2024-10-15 DIAGNOSIS — I4821 Permanent atrial fibrillation: Secondary | ICD-10-CM | POA: Diagnosis not present

## 2024-10-15 MED ORDER — METOPROLOL SUCCINATE ER 50 MG PO TB24: 50.0000 mg | ORAL_TABLET | Freq: Two times a day (BID) | ORAL | 3 refills | Status: DC

## 2024-10-15 NOTE — Telephone Encounter (Signed)
 Pt seen in afib clinic today 1/8

## 2024-10-15 NOTE — Progress Notes (Signed)
 "   Primary Care Physician: Geofm Glade PARAS, MD Primary Cardiologist: Lynwood Schilling, MD Electrophysiologist: None     Referring Physician: Dr. Schilling Yolanda Grant is a 84 y.o. female with a history of paroxysmal SVT, mitral valve prolapse, aortic insufficiency, HLD, and atrial fibrillation who presents for consultation in the [facility]. Patient in permanent Afib / rate control strategy. Patient is on Eliquis  5 mg BID for a CHADS2VASC score of 3.  On evaluation today, she is currently in permanent Afib. She has questions about diet / what is safe to eat while taking Eliquis . She also has questions about intermittent bilateral LE swelling, which she notes to have had before diagnosis of Afib. No missed doses of Eliquis .   On follow up 07/31/24, patient is currently in Afib. Noted visit with Dr. Schilling 06/2023 to continue with rate controlled Afib. Patient notes via Apple watch intermittent Afib alerts that affect her sleep. Metoprolol  recently changed to 25 mg AM 25 mg PM. No missed doses of Eliquis .   Follow up 10/15/24. Patient is currently in Afib.  Patient contacted office on 1/8 noting recent bout of bronchitis with taking prednisone , doxycycline , and albuterol  inhaler.  She had noted dramatic increase in heart rates this week.  She is taking Toprol  25 mg twice daily.  No missed doses of Eliquis .  She does feel better overall from her illness.  Today, she denies symptoms of palpitations, chest pain, shortness of breath, orthopnea, PND, lower extremity edema, dizziness, presyncope, syncope, snoring, daytime somnolence, bleeding, or neurologic sequela. The patient is tolerating medications without difficulties and is otherwise without complaint today.    she has a BMI of Body mass index is 28.51 kg/m.SABRA Filed Weights   10/15/24 1425  Weight: 76.5 kg      Current Outpatient Medications  Medication Sig Dispense Refill   albuterol  (VENTOLIN  HFA)  108 (90 Base) MCG/ACT inhaler Inhale 2 puffs into the lungs every 6 (six) hours as needed for wheezing or shortness of breath. 8 g 0   apixaban  (ELIQUIS ) 5 MG TABS tablet Take 1 tablet (5 mg total) by mouth 2 (two) times daily. 180 tablet 2   Biotin 89999 MCG TABS Take 10,000 mcg by mouth in the morning.     Coenzyme Q10 (CO Q 10 PO) Take 200 mg by mouth every morning.     Cranberry 500 MG CAPS Take 500 mg by mouth every evening.     D-Mannose POWD Take 1 Dose by mouth daily.     famotidine  (PEPCID ) 20 MG tablet Take 1 tablet (20 mg total) by mouth daily.     hydrochlorothiazide  (HYDRODIURIL ) 12.5 MG tablet Take 1 tablet (12.5 mg total) by mouth as needed (for lower extremity swelling.). 90 tablet 3   HYDROcodone  bit-homatropine (HYCODAN) 5-1.5 MG/5ML syrup Take 5 mLs by mouth every 8 (eight) hours as needed for cough. 120 mL 0   METAMUCIL FIBER PO Take 1 Scoop by mouth daily as needed (constipation).     metoprolol  succinate (TOPROL -XL) 25 MG 24 hr tablet Take 1 tablet (25 mg total) by mouth in the morning and at bedtime. 60 tablet 11   Multiple Minerals-Vitamins (CALCIUM-MAGNESIUM-ZINC-D3) TABS Take 1 tablet by mouth 2 (two) times daily.     Multiple Vitamin (ONE-DAILY MULTI-VITAMIN) TABS Take 1 tablet by mouth daily.     Omega-3 Fatty Acids (OMEGA 3 FISH OIL PO) Take 1 capsule by mouth every morning. Ultimate Omega-1280 mg  Polyethyl Glycol-Propyl Glycol (SYSTANE ULTRA) 0.4-0.3 % SOLN Place 1 drop into both eyes 2 (two) times daily.     pravastatin  (PRAVACHOL ) 20 MG tablet TAKE 1 TABLET BY MOUTH EVERY DAY 90 tablet 2   Probiotic Product (PROBIOTIC PO) Take 1 capsule by mouth daily.     raloxifene  (EVISTA ) 60 MG tablet TAKE 1 TABLET BY MOUTH EVERY DAY 90 tablet 3   Spacer/Aero-Holding Chambers DEVI 1 Device by Does not apply route every 4 (four) hours. 1 each 0   No current facility-administered medications for this encounter.    Atrial Fibrillation Management history:  Previous  antiarrhythmic drugs: none Previous cardioversions: 06/12/23 Previous ablations: none Anticoagulation history: Eliquis    ROS- All systems are reviewed and negative except as per the HPI above.  Physical Exam: BP (!) 132/98   Pulse (!) 132   Ht 5' 4.5 (1.638 m)   Wt 76.5 kg   BMI 28.51 kg/m   GEN- The patient is well appearing, alert and oriented x 3 today.   Neck - no JVD or carotid bruit noted Lungs- Clear to ausculation bilaterally, normal work of breathing Heart- Irregular tachycardic rate and rhythm, no murmurs, rubs or gallops, PMI not laterally displaced Extremities- no clubbing, cyanosis, or edema Skin - no rash or ecchymosis noted    EKG today demonstrates  EKG Interpretation Date/Time:  Thursday October 15 2024 14:28:33 EST Ventricular Rate:  132 PR Interval:    QRS Duration:  66 QT Interval:  300 QTC Calculation: 444 R Axis:   -35  Text Interpretation: Atrial fibrillation with rapid ventricular response Left axis deviation Low voltage QRS Cannot rule out Anteroseptal infarct (cited on or before 06-Apr-2024) Abnormal ECG When compared with ECG of 31-Jul-2024 10:06, No significant change was found Confirmed by Terra Pac ([id number]) on 10/15/2024 2:30:04 PM    Echo 11/30/22 demonstrated   1. Left ventricular ejection fraction, by estimation, is 60 to 65%. The  left ventricle has normal function. The left ventricle has no regional  wall motion abnormalities. There is mild concentric left ventricular  hypertrophy. Left ventricular diastolic  parameters were normal.   2. Right ventricular systolic function is normal. The right ventricular  size is normal. There is normal pulmonary artery systolic pressure.   3. The mitral valve is normal in structure. Trivial mitral valve  regurgitation. No evidence of mitral stenosis. Significant prolapse not  seen in this study.   4. The aortic valve was not well visualized. Aortic valve regurgitation  is mild to moderate. No aortic  stenosis is present.   5. The inferior vena cava is normal in size with greater than 50%  respiratory variability, suggesting right atrial pressure of 3 mmHg.   CHA2DS2-VASc Score = 3  The patient's score is based upon: CHF History: 0 HTN History: 0 Diabetes History: 0 Stroke History: 0 Vascular Disease History: 0 Age Score: 2 Gender Score: 1      ASSESSMENT AND PLAN: Permanent Atrial Fibrillation (ICD10:  I48.11) The patient's CHA2DS2-VASc score is 3, indicating a 3.2% annual risk of stroke.    Patient is currently in permanent Afib. She is concerned about her HR elevated in the setting of taking albuterol  inhaler, antibiotic, and prednisone  for bronchitis.  Will go ahead and recommend patient to transition to Toprol  50 mg twice daily.  We discussed that she may have to eventually go back to her original dose of 25 mg twice daily once she has recovered from her illness but for now we will  continue with the increase in rate control.  Patient will contact clinic with an update on heart rate next week.  Secondary Hypercoagulable State (ICD10:  D68.69) The patient is at significant risk for stroke/thromboembolism based upon her CHA2DS2-VASc Score of 3.  Continue Apixaban  (Eliquis ).  Continue Eliquis  5 mg BID.      Follow up as scheduled with Dr Lavona.    Terra Pac, PA-C  Af[facility] Clinic [facility] [address] [place], [place] [address] [phone]  "

## 2024-10-15 NOTE — Telephone Encounter (Addendum)
 SPoke with patient recent bronchitis with prednisone  and doxycycline . Pt feels illness is improved after meds (she has completed course) but she continues having intermittent elevated heart rates since beginning of week. Rates have ranged from 66-154 she over all feels ok just fatigued.  Appt made for this afternoon. Pt took her morning medications at 9am unable to get an accurate BP on her home cuff pt states when in afib it will error out.

## 2024-10-15 NOTE — Patient Instructions (Signed)
 Increase metoprolol  succinate (TOPROL -XL) 50 mg to twice a day   Call us  next week with update of heart rate

## 2024-10-22 ENCOUNTER — Telehealth (HOSPITAL_COMMUNITY): Payer: Self-pay | Admitting: *Deleted

## 2024-10-22 ENCOUNTER — Encounter (HOSPITAL_COMMUNITY): Payer: Self-pay | Admitting: *Deleted

## 2024-10-22 MED ORDER — METOPROLOL SUCCINATE ER 25 MG PO TB24: ORAL_TABLET | ORAL | 3 refills | Status: DC

## 2024-10-22 NOTE — Telephone Encounter (Signed)
 Pt called with update of HR since last appt with med changes. She reports HR 109-141 and BP 11/76-119/104. I discussed with J.Suarez P.A. he wants to increase Toprol  to 75 mg in A.M. and 50 mg in P.M. I reviewed with pt she agrees to plan and will call back with update next of HR.

## 2024-10-29 MED ORDER — METOPROLOL SUCCINATE ER 25 MG PO TB24: 75.0000 mg | ORAL_TABLET | Freq: Two times a day (BID) | ORAL | Status: DC

## 2024-10-29 NOTE — Telephone Encounter (Signed)
 Patient called in with update of HR response after increasing metoprolol .  HR ranging from 88-135 BP 107-115/65-88. Discussed with Thom Heinrich PA will increase metoprolol  to 75mg  twice a day. Pt to call with update of response in 1 week. Pt verbalized understanding.

## 2024-10-29 NOTE — Addendum Note (Signed)
 Addended by: FRANCHOT GLADE RAMAN on: 10/29/2024 03:25 PM   Modules accepted: Orders

## 2024-11-05 ENCOUNTER — Ambulatory Visit: Payer: HMO

## 2024-11-05 VITALS — BP 110/70 | HR 86 | Ht 64.5 in | Wt 169.4 lb

## 2024-11-05 DIAGNOSIS — Z0000 Encounter for general adult medical examination without abnormal findings: Secondary | ICD-10-CM | POA: Diagnosis not present

## 2024-11-05 MED ORDER — METOPROLOL SUCCINATE ER 25 MG PO TB24: ORAL_TABLET | ORAL | Status: AC

## 2024-11-05 NOTE — Patient Instructions (Addendum)
 Yolanda Grant,  Thank you for taking the time for your Medicare Wellness Visit. I appreciate your continued commitment to your health goals. Please review the care plan we discussed, and feel free to reach out if I can assist you further.  Please note that Annual Wellness Visits do not include a physical exam. Some assessments may be limited, especially if the visit was conducted virtually. If needed, we may recommend an in-person follow-up with your provider.  Ongoing Care Seeing your primary care provider every 3 to 6 months helps us  monitor your health and provide consistent, personalized care. Next office visit on 05/28/2025.  Keep up the good work.   Referrals If a referral was made during today's visit and you haven't received any updates within two weeks, please contact the referred provider directly to check on the status.  Recommended Screenings:  Health Maintenance  Topic Date Due   COVID-19 Vaccine (4 - 2025-26 season) 06/08/2024   Medicare Annual Wellness Visit  11/03/2024   DTaP/Tdap/Td vaccine (3 - Td or Tdap) 02/14/2025   Osteoporosis screening with Bone Density Scan  06/18/2026   Pneumococcal Vaccine for age over 61  Completed   Flu Shot  Completed   Zoster (Shingles) Vaccine  Completed   Meningitis B Vaccine  Aged Out       11/02/2024    2:07 PM  Advanced Directives  Does Patient Have a Medical Advance Directive? Yes  Type of Advance Directive Healthcare Power of Attorney  Copy of Healthcare Power of Attorney in Chart? No - copy requested    Vision: Annual vision screenings are recommended for early detection of glaucoma, cataracts, and diabetic retinopathy. These exams can also reveal signs of chronic conditions such as diabetes and high blood pressure.  Dental: Annual dental screenings help detect early signs of oral cancer, gum disease, and other conditions linked to overall health, including heart disease and diabetes.  Please see the attached documents for  additional preventive care recommendations.

## 2024-11-05 NOTE — Progress Notes (Signed)
 "  Chief Complaint  Patient presents with   Medicare Wellness     Subjective:   Yolanda Grant is a 84 y.o. female who presents for a Medicare Annual Wellness Visit.  Visit info / Clinical Intake: Medicare Wellness Visit Type:: Subsequent Annual Wellness Visit Persons participating in visit and providing information:: patient Medicare Wellness Visit Mode:: In-person (required for WTM) Interpreter Needed?: No Pre-visit prep was completed: yes AWV questionnaire completed by patient prior to visit?: yes Date:: 11/02/24 Living arrangements:: (!) (Patient-Rptd) lives alone Patient's Overall Health Status Rating: (Patient-Rptd) good Typical amount of pain: (Patient-Rptd) some Does pain affect daily life?: (Patient-Rptd) no Are you currently prescribed opioids?: no  Dietary Habits and Nutritional Risks How many meals a day?: (Patient-Rptd) 3 Eats fruit and vegetables daily?: (Patient-Rptd) yes Most meals are obtained by: (Patient-Rptd) preparing own meals; having others provide food In the last 2 weeks, have you had any of the following?: none Diabetic:: no  Functional Status Activities of Daily Living (to include ambulation/medication): (Patient-Rptd) Independent Ambulation: Independent Medication Administration: (Patient-Rptd) Independent Home Management (perform basic housework or laundry): (Patient-Rptd) Independent Manage your own finances?: (Patient-Rptd) yes Primary transportation is: (Patient-Rptd) driving Concerns about vision?: no *vision screening is required for WTM* Concerns about hearing?: no  Fall Screening Falls in the past year?: (Patient-Rptd) 0 Number of falls in past year: 0 Was there an injury with Fall?: 0 Fall Risk Category Calculator: 0 Patient Fall Risk Level: Low Fall Risk  Fall Risk Patient at Risk for Falls Due to: No Fall Risks Fall risk Follow up: Falls evaluation completed; Falls prevention discussed  Home and Transportation Safety: All  rugs have non-skid backing?: (Patient-Rptd) yes All stairs or steps have railings?: (Patient-Rptd) N/A, no stairs Grab bars in the bathtub or shower?: (!) (Patient-Rptd) no Have non-skid surface in bathtub or shower?: (Patient-Rptd) yes Good home lighting?: (Patient-Rptd) yes Regular seat belt use?: (Patient-Rptd) yes Hospital stays in the last year:: (Patient-Rptd) no  Cognitive Assessment Difficulty concentrating, remembering, or making decisions? : (Patient-Rptd) no Will 6CIT or Mini Cog be Completed: no 6CIT or Mini Cog Declined: patient alert, oriented, able to answer questions appropriately and recall recent events  Advance Directives (For Healthcare) Does Patient Have a Medical Advance Directive?: Yes (Has a will) Type of Advance Directive: Healthcare Power of Attorney Copy of Healthcare Power of Attorney in Chart?: No - copy requested  Reviewed/Updated  Reviewed/Updated: Reviewed All (Medical, Surgical, Family, Medications, Allergies, Care Teams, Patient Goals)    Allergies (verified) Sulfa antibiotics and Augmentin [amoxicillin-pot clavulanate]   Current Medications (verified) Outpatient Encounter Medications as of 11/05/2024  Medication Sig   apixaban  (ELIQUIS ) 5 MG TABS tablet Take 1 tablet (5 mg total) by mouth 2 (two) times daily.   Biotin 89999 MCG TABS Take 10,000 mcg by mouth in the morning.   Coenzyme Q10 (CO Q 10 PO) Take 200 mg by mouth every morning.   Cranberry 500 MG CAPS Take 500 mg by mouth every evening.   D-Mannose POWD Take 1 Dose by mouth daily.   famotidine  (PEPCID ) 20 MG tablet Take 1 tablet (20 mg total) by mouth daily.   hydrochlorothiazide  (HYDRODIURIL ) 12.5 MG tablet Take 1 tablet (12.5 mg total) by mouth as needed (for lower extremity swelling.).   METAMUCIL FIBER PO Take 1 Scoop by mouth daily as needed (constipation).   metoprolol  succinate (TOPROL -XL) 25 MG 24 hr tablet Take 3 tablets (75 mg total) by mouth 2 (two) times daily.   Multiple  Minerals-Vitamins (CALCIUM-MAGNESIUM-ZINC-D3)  TABS Take 1 tablet by mouth 2 (two) times daily.   Multiple Vitamin (ONE-DAILY MULTI-VITAMIN) TABS Take 1 tablet by mouth daily.   Omega-3 Fatty Acids (OMEGA 3 FISH OIL PO) Take 1 capsule by mouth every morning. Ultimate Omega-1280 mg   Polyethyl Glycol-Propyl Glycol (SYSTANE ULTRA) 0.4-0.3 % SOLN Place 1 drop into both eyes 2 (two) times daily.   pravastatin  (PRAVACHOL ) 20 MG tablet TAKE 1 TABLET BY MOUTH EVERY DAY   Probiotic Product (PROBIOTIC PO) Take 1 capsule by mouth daily.   raloxifene  (EVISTA ) 60 MG tablet TAKE 1 TABLET BY MOUTH EVERY DAY   albuterol  (VENTOLIN  HFA) 108 (90 Base) MCG/ACT inhaler Inhale 2 puffs into the lungs every 6 (six) hours as needed for wheezing or shortness of breath. (Patient not taking: Reported on 11/05/2024)   HYDROcodone  bit-homatropine (HYCODAN) 5-1.5 MG/5ML syrup Take 5 mLs by mouth every 8 (eight) hours as needed for cough. (Patient not taking: Reported on 11/05/2024)   Spacer/Aero-Holding Chambers DEVI 1 Device by Does not apply route every 4 (four) hours. (Patient not taking: Reported on 11/05/2024)   No facility-administered encounter medications on file as of 11/05/2024.    History: Past Medical History:  Diagnosis Date   Congenital spondylolisthesis of lumbar region    Hyperlipidemia    Mitral valve prolapse    Spinal stenosis    Past Surgical History:  Procedure Laterality Date   ABDOMINAL HYSTERECTOMY     anemia, heavy bleeding   AUGMENTATION MAMMAPLASTY     BREAST BIOPSY     BREAST RECONSTRUCTION WITH PLACEMENT OF TISSUE EXPANDER AND ALLODERM     CARDIOVERSION N/A 06/12/2023   Procedure: CARDIOVERSION;  Surgeon: Loni Soyla LABOR, MD;  Location: [facility];  Service: Cardiovascular;  Laterality: N/A;   CATARACT EXTRACTION Left 12/28/2021   CATARACT EXTRACTION Right 02/13/2022   TUBAL LIGATION     Family History  Problem Relation Age of Onset   Breast cancer Mother    Social History    Occupational History   Occupation: RETIRED  Tobacco Use   Smoking status: Never   Smokeless tobacco: Never   Tobacco comments:    Never smoked 07/31/24  Vaping Use   Vaping status: Never Used  Substance and Sexual Activity   Alcohol  use: Not Currently   Drug use: Never   Sexual activity: Not on file   Tobacco Counseling Counseling given: Not Answered Tobacco comments: Never smoked 07/31/24  SDOH Screenings   Food Insecurity: No Food Insecurity (11/05/2024)  Housing: Unknown (11/05/2024)  Transportation Needs: No Transportation Needs (11/05/2024)  Utilities: Not At Risk (11/05/2024)  Alcohol  Screen: Low Risk (04/04/2022)  Depression (PHQ2-9): Low Risk (11/05/2024)  Financial Resource Strain: Low Risk (10/05/2024)  Physical Activity: Sufficiently Active (11/05/2024)  Recent Concern: Physical Activity - Insufficiently Active (10/05/2024)  Social Connections: Moderately Integrated (11/05/2024)  Stress: No Stress Concern Present (11/05/2024)  Tobacco Use: Low Risk (11/05/2024)  Health Literacy: Adequate Health Literacy (11/05/2024)   See flowsheets for full screening details  Depression Screen PHQ 2 & 9 Depression Scale- Over the past 2 weeks, how often have you been bothered by any of the following problems? Little interest or pleasure in doing things: 0 Feeling down, depressed, or hopeless (PHQ Adolescent also includes...irritable): 0 PHQ-2 Total Score: 0 Trouble falling or staying asleep, or sleeping too much: 0 Feeling tired or having little energy: 0 Poor appetite or overeating (PHQ Adolescent also includes...weight loss): 0 Feeling bad about yourself - or that you are a failure or  have let yourself or your family down: 0 Trouble concentrating on things, such as reading the newspaper or watching television (PHQ Adolescent also includes...like school work): 0 Moving or speaking so slowly that other people could have noticed. Or the opposite - being so fidgety or restless that  you have been moving around a lot more than usual: 0 Thoughts that you would be better off dead, or of hurting yourself in some way: 0 PHQ-9 Total Score: 0 If you checked off any problems, how difficult have these problems made it for you to do your work, take care of things at home, or get along with other people?: Not difficult at all  Depression Treatment Depression Interventions/Treatment : EYV7-0 Score <4 Follow-up Not Indicated     Goals Addressed               This Visit's Progress     Patient Stated (pt-stated)   On track     My goal is to lose 15 pounds by watching my diet and being more physically active.             Objective:    Today's Vitals   11/05/24 1015  BP: 110/70  Pulse: 86  SpO2: 96%  Weight: 169 lb 6.4 oz (76.8 kg)  Height: 5' 4.5 (1.638 m)   Body mass index is 28.63 kg/m.  Hearing/Vision screen Hearing Screening - Comments:: Denies hearing difficulties   Vision Screening - Comments:: Denies vision issues./UTD/Dr. KYM Gaudy Had cataract Immunizations and Health Maintenance Health Maintenance  Topic Date Due   COVID-19 Vaccine (4 - 2025-26 season) 06/08/2024   DTaP/Tdap/Td (3 - Td or Tdap) 02/14/2025   Medicare Annual Wellness (AWV)  11/05/2025   Bone Density Scan  06/18/2026   Pneumococcal Vaccine: 50+ Years  Completed   Influenza Vaccine  Completed   Zoster Vaccines- Shingrix  Completed   Meningococcal B Vaccine  Aged Out        Assessment/Plan:  This is a routine wellness examination for Alishia.  Patient Care Team: Geofm Glade PARAS, MD as PCP - General (Internal Medicine) Lavona Agent, MD as PCP - Cardiology (Cardiology) Gaudy Bruckner, MD as Consulting Physician (Ophthalmology)  I have personally reviewed and noted the following in the patients chart:   Medical and social history Use of alcohol , tobacco or illicit drugs  Current medications and supplements including opioid prescriptions. Functional ability and  status Nutritional status Physical activity Advanced directives List of other physicians Hospitalizations, surgeries, and ER visits in previous 12 months Vitals Screenings to include cognitive, depression, and falls Referrals and appointments  No orders of the defined types were placed in this encounter.  In addition, I have reviewed and discussed with patient certain preventive protocols, quality metrics, and best practice recommendations. A written personalized care plan for preventive services as well as general preventive health recommendations were provided to patient.   Ronita Hargreaves L Remedy Corporan, CMA   11/05/2024   Return in 1 year (on 11/05/2025).  After Visit Summary: (MyChart) Due to this being a telephonic visit, the after visit summary with patients personalized plan was offered to patient via MyChart   Nurse Notes: No voiced or noted concerns at this time. "

## 2024-11-05 NOTE — Telephone Encounter (Signed)
 Patient called with update of heart rates after metoprolol  increased last week.  1/26 - heart rate 95-101 1/27 - 107-114 1/28 82-111 1/29 - 85  BP has ranged from 102-128/65-88. Pt feels great. Discussed with Thom Heinrich PA will increase metoprolol  to 100mg  AM and 75mg  PM - call with update of response in 1 week. Monitor BP. Pt verbalized agreement.

## 2024-11-05 NOTE — Addendum Note (Signed)
 Addended by: FRANCHOT GLADE RAMAN on: 11/05/2024 03:57 PM   Modules accepted: Orders

## 2024-12-17 ENCOUNTER — Other Ambulatory Visit (HOSPITAL_COMMUNITY)

## 2024-12-21 ENCOUNTER — Ambulatory Visit: Payer: Self-pay | Admitting: Cardiology

## 2025-05-28 ENCOUNTER — Encounter: Admitting: Internal Medicine
